# Patient Record
Sex: Female | Born: 1945 | Race: White | Hispanic: No | Marital: Married | Smoking: Never smoker
Health system: Southern US, Community
[De-identification: ages and names within clinical notes are randomized; demographics above are authoritative.]

## PROBLEM LIST (undated history)

## (undated) DIAGNOSIS — B019 Varicella without complication: Secondary | ICD-10-CM

## (undated) DIAGNOSIS — K635 Polyp of colon: Secondary | ICD-10-CM

## (undated) DIAGNOSIS — D473 Essential (hemorrhagic) thrombocythemia: Secondary | ICD-10-CM

## (undated) DIAGNOSIS — J45909 Unspecified asthma, uncomplicated: Secondary | ICD-10-CM

## (undated) DIAGNOSIS — D649 Anemia, unspecified: Secondary | ICD-10-CM

## (undated) DIAGNOSIS — K5790 Diverticulosis of intestine, part unspecified, without perforation or abscess without bleeding: Secondary | ICD-10-CM

## (undated) DIAGNOSIS — R7612 Nonspecific reaction to cell mediated immunity measurement of gamma interferon antigen response without active tuberculosis: Secondary | ICD-10-CM

## (undated) DIAGNOSIS — T7840XA Allergy, unspecified, initial encounter: Secondary | ICD-10-CM

## (undated) DIAGNOSIS — H409 Unspecified glaucoma: Secondary | ICD-10-CM

## (undated) DIAGNOSIS — K5792 Diverticulitis of intestine, part unspecified, without perforation or abscess without bleeding: Secondary | ICD-10-CM

## (undated) DIAGNOSIS — E785 Hyperlipidemia, unspecified: Secondary | ICD-10-CM

## (undated) HISTORY — DX: Anemia, unspecified: D64.9

## (undated) HISTORY — DX: Nonspecific reaction to cell mediated immunity measurement of gamma interferon antigen response without active tuberculosis: R76.12

## (undated) HISTORY — DX: Essential (hemorrhagic) thrombocythemia: D47.3

## (undated) HISTORY — DX: Polyp of colon: K63.5

## (undated) HISTORY — DX: Allergy, unspecified, initial encounter: T78.40XA

## (undated) HISTORY — DX: Hyperlipidemia, unspecified: E78.5

## (undated) HISTORY — DX: Varicella without complication: B01.9

## (undated) HISTORY — DX: Diverticulitis of intestine, part unspecified, without perforation or abscess without bleeding: K57.92

## (undated) HISTORY — PX: TRIGGER FINGER RELEASE: SHX641

## (undated) HISTORY — DX: Unspecified asthma, uncomplicated: J45.909

## (undated) HISTORY — DX: Diverticulosis of intestine, part unspecified, without perforation or abscess without bleeding: K57.90

## (undated) HISTORY — DX: Unspecified glaucoma: H40.9

---

## 2003-12-18 ENCOUNTER — Encounter: Payer: Self-pay | Admitting: General Practice

## 2004-01-18 ENCOUNTER — Encounter: Payer: Self-pay | Admitting: General Practice

## 2004-03-09 ENCOUNTER — Ambulatory Visit: Payer: Self-pay | Admitting: General Practice

## 2004-08-29 ENCOUNTER — Ambulatory Visit: Payer: Self-pay | Admitting: Unknown Physician Specialty

## 2005-02-06 ENCOUNTER — Ambulatory Visit: Payer: Self-pay | Admitting: Internal Medicine

## 2005-09-13 ENCOUNTER — Ambulatory Visit: Payer: Self-pay | Admitting: Unknown Physician Specialty

## 2006-10-01 ENCOUNTER — Ambulatory Visit: Payer: Self-pay | Admitting: Unknown Physician Specialty

## 2007-10-08 ENCOUNTER — Ambulatory Visit: Payer: Self-pay | Admitting: Unknown Physician Specialty

## 2008-01-26 ENCOUNTER — Ambulatory Visit: Payer: Self-pay | Admitting: Gastroenterology

## 2008-06-15 ENCOUNTER — Encounter: Payer: Self-pay | Admitting: Unknown Physician Specialty

## 2008-06-17 ENCOUNTER — Encounter: Payer: Self-pay | Admitting: Unknown Physician Specialty

## 2008-10-11 ENCOUNTER — Ambulatory Visit: Payer: Self-pay | Admitting: Unknown Physician Specialty

## 2009-07-12 ENCOUNTER — Other Ambulatory Visit: Payer: Self-pay | Admitting: Internal Medicine

## 2009-07-29 ENCOUNTER — Other Ambulatory Visit: Payer: Self-pay | Admitting: Internal Medicine

## 2009-10-12 ENCOUNTER — Ambulatory Visit: Payer: Self-pay | Admitting: Unknown Physician Specialty

## 2010-10-17 ENCOUNTER — Ambulatory Visit: Payer: Self-pay | Admitting: Unknown Physician Specialty

## 2010-10-24 ENCOUNTER — Other Ambulatory Visit: Payer: Self-pay | Admitting: Internal Medicine

## 2011-09-26 ENCOUNTER — Ambulatory Visit: Payer: Self-pay | Admitting: Family Medicine

## 2011-10-05 ENCOUNTER — Ambulatory Visit: Payer: Self-pay | Admitting: General Practice

## 2012-01-02 ENCOUNTER — Other Ambulatory Visit: Payer: Self-pay | Admitting: Internal Medicine

## 2012-01-02 LAB — COMPREHENSIVE METABOLIC PANEL
Albumin: 3.7 g/dL (ref 3.4–5.0)
Alkaline Phosphatase: 98 U/L (ref 50–136)
Anion Gap: 6 — ABNORMAL LOW (ref 7–16)
Bilirubin,Total: 0.4 mg/dL (ref 0.2–1.0)
Calcium, Total: 8.9 mg/dL (ref 8.5–10.1)
Chloride: 104 mmol/L (ref 98–107)
Co2: 30 mmol/L (ref 21–32)
EGFR (Non-African Amer.): >60
Glucose: 88 mg/dL (ref 65–99)
Osmolality: 280 (ref 275–301)
SGOT(AST): 19 U/L (ref 15–37)
SGPT (ALT): 20 U/L (ref 12–78)
Total Protein: 7.3 g/dL (ref 6.4–8.2)

## 2012-01-02 LAB — CBC WITH DIFFERENTIAL/PLATELET
Eosinophil #: 0.2 10*3/uL (ref 0.0–0.7)
HCT: 37.3 % (ref 35.0–47.0)
Lymphocyte #: 1.5 10*3/uL (ref 1.0–3.6)
MCH: 30.3 pg (ref 26.0–34.0)
MCHC: 34.1 g/dL (ref 32.0–36.0)
Monocyte #: 0.3 x10 3/mm (ref 0.2–0.9)
Neutrophil %: 58 %
Platelet: 399 10*3/uL (ref 150–440)
RBC: 4.2 10*6/uL (ref 3.80–5.20)

## 2012-10-30 ENCOUNTER — Ambulatory Visit: Payer: Self-pay | Admitting: Family Medicine

## 2013-05-25 LAB — HM PAP SMEAR: HM PAP: NORMAL

## 2013-05-25 LAB — HM MAMMOGRAPHY: HM MAMMO: NEGATIVE

## 2013-06-01 ENCOUNTER — Ambulatory Visit: Payer: Self-pay | Admitting: Gastroenterology

## 2013-06-02 ENCOUNTER — Ambulatory Visit: Payer: Self-pay | Admitting: Gastroenterology

## 2013-06-04 LAB — PATHOLOGY REPORT

## 2013-10-14 ENCOUNTER — Ambulatory Visit: Payer: Self-pay | Admitting: Internal Medicine

## 2013-10-14 LAB — IRON AND TIBC
IRON: 17 ug/dL — AB (ref 50–170)
Iron Bind.Cap.(Total): 212 ug/dL — ABNORMAL LOW (ref 250–450)
Iron Saturation: 8 %
UNBOUND IRON-BIND. CAP.: 195 ug/dL

## 2013-10-14 LAB — HEPATIC FUNCTION PANEL A (ARMC)
ALT: 23 U/L
Albumin: 2.4 g/dL — ABNORMAL LOW (ref 3.4–5.0)
Alkaline Phosphatase: 107 U/L
Bilirubin, Direct: <0.1 mg/dL (ref 0.00–0.20)
Bilirubin,Total: 0.2 mg/dL (ref 0.2–1.0)
SGOT(AST): 16 U/L (ref 15–37)
Total Protein: 7.1 g/dL (ref 6.4–8.2)

## 2013-10-14 LAB — CBC CANCER CENTER
BASOS ABS: 2 %
EOS PCT: 1 %
HCT: 31.2 % — ABNORMAL LOW (ref 35.0–47.0)
HGB: 10.1 g/dL — AB (ref 12.0–16.0)
LYMPHS PCT: 13 %
MCH: 27.9 pg (ref 26.0–34.0)
MCHC: 32.3 g/dL (ref 32.0–36.0)
MCV: 86 fL (ref 80–100)
MONOS PCT: 3 %
PLATELETS: 1464 x10 3/mm — AB (ref 150–440)
RBC: 3.61 10*6/uL — ABNORMAL LOW (ref 3.80–5.20)
RDW: 13.4 % (ref 11.5–14.5)
SEGMENTED NEUTROPHILS: 81 %
WBC: 12.7 x10 3/mm — ABNORMAL HIGH (ref 3.6–11.0)

## 2013-10-14 LAB — RETICULOCYTES
Absolute Retic Count: 0.0477 10*6/uL (ref 0.019–0.186)
Reticulocyte: 1.32 % (ref 0.4–3.1)

## 2013-10-14 LAB — APTT: Activated PTT: 43.5 secs — ABNORMAL HIGH (ref 23.6–35.9)

## 2013-10-14 LAB — PROTIME-INR
INR: 1.1
PROTHROMBIN TIME: 14.1 s (ref 11.5–14.7)

## 2013-10-14 LAB — CREATININE, SERUM
CREATININE: 0.82 mg/dL (ref 0.60–1.30)
EGFR (African American): >60
EGFR (Non-African Amer.): >60

## 2013-10-14 LAB — CALCIUM: CALCIUM: 9 mg/dL (ref 8.5–10.1)

## 2013-10-14 LAB — LACTATE DEHYDROGENASE: LDH: 177 U/L (ref 81–246)

## 2013-10-16 LAB — PROT IMMUNOELECTROPHORES(ARMC)

## 2013-10-16 LAB — URINE IEP, RANDOM

## 2013-10-17 ENCOUNTER — Ambulatory Visit: Payer: Self-pay | Admitting: Internal Medicine

## 2013-10-19 LAB — OCCULT BLOOD X 1 CARD TO LAB, STOOL
Occult Blood, Feces: NEGATIVE
Occult Blood, Feces: NEGATIVE

## 2013-10-20 LAB — CBC WITH DIFFERENTIAL/PLATELET
Basophil #: 0 10*3/uL (ref 0.0–0.1)
Basophil %: 0.2 %
Eosinophil #: 0.2 10*3/uL (ref 0.0–0.7)
Eosinophil %: 1.6 %
HCT: 29.9 % — ABNORMAL LOW (ref 35.0–47.0)
HGB: 9.7 g/dL — AB (ref 12.0–16.0)
LYMPHS PCT: 15 %
Lymphocyte #: 2 10*3/uL (ref 1.0–3.6)
MCH: 28.1 pg (ref 26.0–34.0)
MCHC: 32.5 g/dL (ref 32.0–36.0)
MCV: 87 fL (ref 80–100)
MONO ABS: 0.6 x10 3/mm (ref 0.2–0.9)
Monocyte %: 4.6 %
NEUTROS PCT: 78.6 %
Neutrophil #: 10.6 10*3/uL — ABNORMAL HIGH (ref 1.4–6.5)
PLATELETS: 1557 10*3/uL — AB (ref 150–440)
RBC: 3.45 10*6/uL — ABNORMAL LOW (ref 3.80–5.20)
RDW: 13.4 % (ref 11.5–14.5)
WBC: 13.5 10*3/uL — AB (ref 3.6–11.0)

## 2013-11-03 LAB — CBC CANCER CENTER
BASOS ABS: 0.1 x10 3/mm (ref 0.0–0.1)
BASOS PCT: 1.3 %
EOS PCT: 1.8 %
Eosinophil #: 0.1 x10 3/mm (ref 0.0–0.7)
HCT: 29.2 % — ABNORMAL LOW (ref 35.0–47.0)
HGB: 9.5 g/dL — ABNORMAL LOW (ref 12.0–16.0)
Lymphocyte #: 1.2 x10 3/mm (ref 1.0–3.6)
Lymphocyte %: 28.3 %
MCH: 28.4 pg (ref 26.0–34.0)
MCHC: 32.5 g/dL (ref 32.0–36.0)
MCV: 87 fL (ref 80–100)
MONO ABS: 0.3 x10 3/mm (ref 0.2–0.9)
MONOS PCT: 6.2 %
Neutrophil #: 2.7 x10 3/mm (ref 1.4–6.5)
Neutrophil %: 62.4 %
PLATELETS: 878 x10 3/mm — AB (ref 150–440)
RBC: 3.34 10*6/uL — AB (ref 3.80–5.20)
RDW: 13.8 % (ref 11.5–14.5)
WBC: 4.3 x10 3/mm (ref 3.6–11.0)

## 2013-11-10 LAB — CBC CANCER CENTER
Basophil #: 0 x10 3/mm (ref 0.0–0.1)
Basophil %: 1.3 %
EOS PCT: 1.9 %
Eosinophil #: 0.1 x10 3/mm (ref 0.0–0.7)
HCT: 28.6 % — ABNORMAL LOW (ref 35.0–47.0)
HGB: 9.3 g/dL — AB (ref 12.0–16.0)
LYMPHS ABS: 1.3 x10 3/mm (ref 1.0–3.6)
Lymphocyte %: 37.2 %
MCH: 29.1 pg (ref 26.0–34.0)
MCHC: 32.5 g/dL (ref 32.0–36.0)
MCV: 90 fL (ref 80–100)
Monocyte #: 0.2 x10 3/mm (ref 0.2–0.9)
Monocyte %: 5.7 %
Neutrophil #: 1.9 x10 3/mm (ref 1.4–6.5)
Neutrophil %: 53.9 %
Platelet: 624 x10 3/mm — ABNORMAL HIGH (ref 150–440)
RBC: 3.19 10*6/uL — ABNORMAL LOW (ref 3.80–5.20)
RDW: 14 % (ref 11.5–14.5)
WBC: 3.6 x10 3/mm (ref 3.6–11.0)

## 2013-11-17 ENCOUNTER — Ambulatory Visit: Payer: Self-pay | Admitting: Internal Medicine

## 2013-11-17 LAB — CBC CANCER CENTER
Basophil #: 0 x10 3/mm (ref 0.0–0.1)
Basophil %: 1.2 %
Eosinophil #: 0 x10 3/mm (ref 0.0–0.7)
Eosinophil %: 1.2 %
HCT: 29.1 % — AB (ref 35.0–47.0)
HGB: 9.5 g/dL — ABNORMAL LOW (ref 12.0–16.0)
LYMPHS PCT: 38.7 %
Lymphocyte #: 1.4 x10 3/mm (ref 1.0–3.6)
MCH: 30.2 pg (ref 26.0–34.0)
MCHC: 32.7 g/dL (ref 32.0–36.0)
MCV: 93 fL (ref 80–100)
MONOS PCT: 6.2 %
Monocyte #: 0.2 x10 3/mm (ref 0.2–0.9)
Neutrophil #: 1.8 x10 3/mm (ref 1.4–6.5)
Neutrophil %: 52.7 %
PLATELETS: 386 x10 3/mm (ref 150–440)
RBC: 3.15 10*6/uL — AB (ref 3.80–5.20)
RDW: 14.1 % (ref 11.5–14.5)
WBC: 3.5 x10 3/mm — AB (ref 3.6–11.0)

## 2013-11-20 LAB — CBC CANCER CENTER
Basophil #: 0 x10 3/mm (ref 0.0–0.1)
Basophil %: 1.5 %
EOS PCT: 1.5 %
Eosinophil #: 0 x10 3/mm (ref 0.0–0.7)
HCT: 28 % — AB (ref 35.0–47.0)
HGB: 9.1 g/dL — AB (ref 12.0–16.0)
LYMPHS ABS: 1.4 x10 3/mm (ref 1.0–3.6)
LYMPHS PCT: 44 %
MCH: 30.2 pg (ref 26.0–34.0)
MCHC: 32.6 g/dL (ref 32.0–36.0)
MCV: 93 fL (ref 80–100)
Monocyte #: 0.2 x10 3/mm (ref 0.2–0.9)
Monocyte %: 5.9 %
NEUTROS ABS: 1.5 x10 3/mm (ref 1.4–6.5)
NEUTROS PCT: 47.1 %
PLATELETS: 288 x10 3/mm (ref 150–440)
RBC: 3.02 10*6/uL — ABNORMAL LOW (ref 3.80–5.20)
RDW: 25.6 % — ABNORMAL HIGH (ref 11.5–14.5)
WBC: 3.2 x10 3/mm — ABNORMAL LOW (ref 3.6–11.0)

## 2013-11-30 ENCOUNTER — Ambulatory Visit: Payer: Self-pay | Admitting: Internal Medicine

## 2013-11-30 LAB — BASIC METABOLIC PANEL
Anion Gap: 3 — ABNORMAL LOW (ref 7–16)
BUN: 14 mg/dL (ref 7–18)
CREATININE: 0.77 mg/dL (ref 0.60–1.30)
Calcium, Total: 8.7 mg/dL (ref 8.5–10.1)
Chloride: 105 mmol/L (ref 98–107)
Co2: 32 mmol/L (ref 21–32)
EGFR (African American): >60
EGFR (Non-African Amer.): >60
Glucose: 86 mg/dL (ref 65–99)
OSMOLALITY: 279 (ref 275–301)
Potassium: 3.8 mmol/L (ref 3.5–5.1)
Sodium: 140 mmol/L (ref 136–145)

## 2013-11-30 LAB — CBC CANCER CENTER
BASOS PCT: 1.4 %
Basophil #: 0.1 x10 3/mm (ref 0.0–0.1)
EOS PCT: 3 %
Eosinophil #: 0.1 x10 3/mm (ref 0.0–0.7)
HCT: 30.5 % — AB (ref 35.0–47.0)
HGB: 9.9 g/dL — ABNORMAL LOW (ref 12.0–16.0)
LYMPHS ABS: 1.3 x10 3/mm (ref 1.0–3.6)
LYMPHS PCT: 33.3 %
MCH: 31.6 pg (ref 26.0–34.0)
MCHC: 32.5 g/dL (ref 32.0–36.0)
MCV: 97 fL (ref 80–100)
Monocyte #: 0.2 x10 3/mm (ref 0.2–0.9)
Monocyte %: 5.7 %
NEUTROS ABS: 2.2 x10 3/mm (ref 1.4–6.5)
Neutrophil %: 56.6 %
PLATELETS: 213 x10 3/mm (ref 150–440)
RBC: 3.14 10*6/uL — ABNORMAL LOW (ref 3.80–5.20)
RDW: 32.3 % — ABNORMAL HIGH (ref 11.5–14.5)
WBC: 3.8 x10 3/mm (ref 3.6–11.0)

## 2013-11-30 LAB — HEPATIC FUNCTION PANEL A (ARMC)
ALT: 16 U/L
Albumin: 3.1 g/dL — ABNORMAL LOW (ref 3.4–5.0)
Alkaline Phosphatase: 97 U/L
BILIRUBIN TOTAL: 0.3 mg/dL (ref 0.2–1.0)
Bilirubin, Direct: <0.05 mg/dL (ref 0.00–0.20)
SGOT(AST): 15 U/L (ref 15–37)
TOTAL PROTEIN: 6.8 g/dL (ref 6.4–8.2)

## 2013-12-08 LAB — CBC CANCER CENTER
Basophil #: 0 x10 3/mm (ref 0.0–0.1)
Basophil %: 1 %
Eosinophil #: 0.1 x10 3/mm (ref 0.0–0.7)
Eosinophil %: 2.3 %
HCT: 30.9 % — AB (ref 35.0–47.0)
HGB: 10.1 g/dL — AB (ref 12.0–16.0)
Lymphocyte #: 1.4 x10 3/mm (ref 1.0–3.6)
Lymphocyte %: 37.6 %
MCH: 32.4 pg (ref 26.0–34.0)
MCHC: 32.6 g/dL (ref 32.0–36.0)
MCV: 99 fL (ref 80–100)
Monocyte #: 0.2 x10 3/mm (ref 0.2–0.9)
Monocyte %: 6.8 %
NEUTROS ABS: 1.9 x10 3/mm (ref 1.4–6.5)
Neutrophil %: 52.3 %
PLATELETS: 288 x10 3/mm (ref 150–440)
RBC: 3.11 10*6/uL — AB (ref 3.80–5.20)
RDW: 31.3 % — ABNORMAL HIGH (ref 11.5–14.5)
WBC: 3.6 x10 3/mm (ref 3.6–11.0)

## 2013-12-15 LAB — CBC CANCER CENTER
Basophil #: 0 x10 3/mm (ref 0.0–0.1)
Basophil %: 0.8 %
Eosinophil #: 0.1 x10 3/mm (ref 0.0–0.7)
Eosinophil %: 2 %
HCT: 31.7 % — ABNORMAL LOW (ref 35.0–47.0)
HGB: 10.2 g/dL — ABNORMAL LOW (ref 12.0–16.0)
LYMPHS PCT: 31.7 %
Lymphocyte #: 1.2 x10 3/mm (ref 1.0–3.6)
MCH: 32.6 pg (ref 26.0–34.0)
MCHC: 32.3 g/dL (ref 32.0–36.0)
MCV: 101 fL — AB (ref 80–100)
Monocyte #: 0.2 x10 3/mm (ref 0.2–0.9)
Monocyte %: 5.5 %
NEUTROS ABS: 2.4 x10 3/mm (ref 1.4–6.5)
Neutrophil %: 60 %
Platelet: 359 x10 3/mm (ref 150–440)
RBC: 3.14 10*6/uL — AB (ref 3.80–5.20)
RDW: 30.8 % — ABNORMAL HIGH (ref 11.5–14.5)
WBC: 3.9 x10 3/mm (ref 3.6–11.0)

## 2013-12-17 ENCOUNTER — Ambulatory Visit: Payer: Self-pay | Admitting: Internal Medicine

## 2013-12-22 LAB — CBC CANCER CENTER
BASOS ABS: 0 x10 3/mm (ref 0.0–0.1)
Basophil %: 1.1 %
EOS ABS: 0.1 x10 3/mm (ref 0.0–0.7)
Eosinophil %: 1.3 %
HCT: 32.5 % — ABNORMAL LOW (ref 35.0–47.0)
HGB: 10.6 g/dL — ABNORMAL LOW (ref 12.0–16.0)
Lymphocyte #: 1.4 x10 3/mm (ref 1.0–3.6)
Lymphocyte %: 34.1 %
MCH: 33.2 pg (ref 26.0–34.0)
MCHC: 32.6 g/dL (ref 32.0–36.0)
MCV: 102 fL — AB (ref 80–100)
MONOS PCT: 8.4 %
Monocyte #: 0.3 x10 3/mm (ref 0.2–0.9)
NEUTROS ABS: 2.3 x10 3/mm (ref 1.4–6.5)
NEUTROS PCT: 55.1 %
PLATELETS: 328 x10 3/mm (ref 150–440)
RBC: 3.19 10*6/uL — AB (ref 3.80–5.20)
RDW: 30.1 % — ABNORMAL HIGH (ref 11.5–14.5)
WBC: 4.1 x10 3/mm (ref 3.6–11.0)

## 2013-12-29 LAB — CBC CANCER CENTER
BASOS ABS: 0.1 x10 3/mm (ref 0.0–0.1)
Basophil %: 1.2 %
Eosinophil #: 0.1 x10 3/mm (ref 0.0–0.7)
Eosinophil %: 1.2 %
HCT: 33.2 % — AB (ref 35.0–47.0)
HGB: 10.7 g/dL — ABNORMAL LOW (ref 12.0–16.0)
LYMPHS PCT: 32.5 %
Lymphocyte #: 1.5 x10 3/mm (ref 1.0–3.6)
MCH: 33.5 pg (ref 26.0–34.0)
MCHC: 32.4 g/dL (ref 32.0–36.0)
MCV: 103 fL — AB (ref 80–100)
MONO ABS: 0.3 x10 3/mm (ref 0.2–0.9)
Monocyte %: 7.3 %
NEUTROS PCT: 57.8 %
Neutrophil #: 2.7 x10 3/mm (ref 1.4–6.5)
PLATELETS: 291 x10 3/mm (ref 150–440)
RBC: 3.21 10*6/uL — AB (ref 3.80–5.20)
RDW: 28.5 % — ABNORMAL HIGH (ref 11.5–14.5)
WBC: 4.7 x10 3/mm (ref 3.6–11.0)

## 2014-01-12 LAB — BASIC METABOLIC PANEL
Anion Gap: 4 — ABNORMAL LOW (ref 7–16)
BUN: 15 mg/dL (ref 7–18)
CALCIUM: 8.2 mg/dL — AB (ref 8.5–10.1)
Chloride: 110 mmol/L — ABNORMAL HIGH (ref 98–107)
Co2: 31 mmol/L (ref 21–32)
Creatinine: 0.82 mg/dL (ref 0.60–1.30)
EGFR (African American): >60
EGFR (Non-African Amer.): >60
GLUCOSE: 75 mg/dL (ref 65–99)
Osmolality: 288 (ref 275–301)
Potassium: 3.7 mmol/L (ref 3.5–5.1)
Sodium: 145 mmol/L (ref 136–145)

## 2014-01-12 LAB — FERRITIN: FERRITIN (ARMC): 19 ng/mL (ref 8–388)

## 2014-01-12 LAB — CBC CANCER CENTER
Basophil #: 0.1 x10 3/mm (ref 0.0–0.1)
Basophil %: 1.2 %
Eosinophil #: 0.2 x10 3/mm (ref 0.0–0.7)
Eosinophil %: 3.8 %
HCT: 35.6 % (ref 35.0–47.0)
HGB: 11.7 g/dL — AB (ref 12.0–16.0)
LYMPHS ABS: 1.5 x10 3/mm (ref 1.0–3.6)
LYMPHS PCT: 33.3 %
MCH: 35.1 pg — ABNORMAL HIGH (ref 26.0–34.0)
MCHC: 32.9 g/dL (ref 32.0–36.0)
MCV: 107 fL — AB (ref 80–100)
Monocyte #: 0.3 x10 3/mm (ref 0.2–0.9)
Monocyte %: 6 %
NEUTROS PCT: 55.7 %
Neutrophil #: 2.4 x10 3/mm (ref 1.4–6.5)
PLATELETS: 295 x10 3/mm (ref 150–440)
RBC: 3.34 10*6/uL — AB (ref 3.80–5.20)
RDW: 25.3 % — AB (ref 11.5–14.5)
WBC: 4.4 x10 3/mm (ref 3.6–11.0)

## 2014-01-12 LAB — HEPATIC FUNCTION PANEL A (ARMC)
ALT: 18 U/L
AST: 25 U/L (ref 15–37)
Albumin: 3.5 g/dL (ref 3.4–5.0)
Alkaline Phosphatase: 91 U/L
BILIRUBIN TOTAL: 0.2 mg/dL (ref 0.2–1.0)
Total Protein: 6.7 g/dL (ref 6.4–8.2)

## 2014-01-12 LAB — IRON AND TIBC
IRON BIND. CAP.(TOTAL): 327 ug/dL (ref 250–450)
IRON: 59 ug/dL (ref 50–170)
Iron Saturation: 18 %
Unbound Iron-Bind.Cap.: 268 ug/dL

## 2014-01-17 ENCOUNTER — Ambulatory Visit: Payer: Self-pay | Admitting: Internal Medicine

## 2014-01-26 LAB — CBC CANCER CENTER
Basophil #: 0.1 x10 3/mm (ref 0.0–0.1)
Basophil %: 1.2 %
EOS PCT: 3.4 %
Eosinophil #: 0.2 x10 3/mm (ref 0.0–0.7)
HCT: 35.8 % (ref 35.0–47.0)
HGB: 11.7 g/dL — ABNORMAL LOW (ref 12.0–16.0)
Lymphocyte #: 1.6 x10 3/mm (ref 1.0–3.6)
Lymphocyte %: 29.4 %
MCH: 35.3 pg — ABNORMAL HIGH (ref 26.0–34.0)
MCHC: 32.6 g/dL (ref 32.0–36.0)
MCV: 109 fL — AB (ref 80–100)
Monocyte #: 0.3 x10 3/mm (ref 0.2–0.9)
Monocyte %: 5.6 %
Neutrophil #: 3.2 x10 3/mm (ref 1.4–6.5)
Neutrophil %: 60.4 %
Platelet: 252 x10 3/mm (ref 150–440)
RBC: 3.3 10*6/uL — ABNORMAL LOW (ref 3.80–5.20)
RDW: 17 % — ABNORMAL HIGH (ref 11.5–14.5)
WBC: 5.3 x10 3/mm (ref 3.6–11.0)

## 2014-02-09 LAB — CBC CANCER CENTER
Basophil #: 0 x10 3/mm (ref 0.0–0.1)
Basophil %: 0.7 %
EOS PCT: 1.6 %
Eosinophil #: 0 x10 3/mm (ref 0.0–0.7)
HCT: 37 % (ref 35.0–47.0)
HGB: 12.1 g/dL (ref 12.0–16.0)
LYMPHS ABS: 0.9 x10 3/mm — AB (ref 1.0–3.6)
Lymphocyte %: 29.4 %
MCH: 35.5 pg — AB (ref 26.0–34.0)
MCHC: 32.8 g/dL (ref 32.0–36.0)
MCV: 108 fL — AB (ref 80–100)
Monocyte #: 0.2 x10 3/mm (ref 0.2–0.9)
Monocyte %: 8.2 %
Neutrophil #: 1.8 x10 3/mm (ref 1.4–6.5)
Neutrophil %: 60.1 %
Platelet: 242 x10 3/mm (ref 150–440)
RBC: 3.42 10*6/uL — ABNORMAL LOW (ref 3.80–5.20)
RDW: 11.6 % (ref 11.5–14.5)
WBC: 3 x10 3/mm — AB (ref 3.6–11.0)

## 2014-02-16 ENCOUNTER — Ambulatory Visit: Payer: Self-pay | Admitting: Internal Medicine

## 2014-02-23 LAB — CBC CANCER CENTER
BASOS PCT: 1.4 %
Basophil #: 0.1 x10 3/mm (ref 0.0–0.1)
Eosinophil #: 0.1 x10 3/mm (ref 0.0–0.7)
Eosinophil %: 1.9 %
HCT: 35.6 % (ref 35.0–47.0)
HGB: 11.8 g/dL — AB (ref 12.0–16.0)
LYMPHS ABS: 1.6 x10 3/mm (ref 1.0–3.6)
Lymphocyte %: 31.1 %
MCH: 35.6 pg — AB (ref 26.0–34.0)
MCHC: 33.2 g/dL (ref 32.0–36.0)
MCV: 107 fL — ABNORMAL HIGH (ref 80–100)
MONOS PCT: 5.7 %
Monocyte #: 0.3 x10 3/mm (ref 0.2–0.9)
NEUTROS ABS: 3.1 x10 3/mm (ref 1.4–6.5)
Neutrophil %: 59.9 %
Platelet: 305 x10 3/mm (ref 150–440)
RBC: 3.33 10*6/uL — AB (ref 3.80–5.20)
RDW: 11.5 % (ref 11.5–14.5)
WBC: 5.2 x10 3/mm (ref 3.6–11.0)

## 2014-03-09 LAB — HEPATIC FUNCTION PANEL A (ARMC)
ALBUMIN: 3.3 g/dL — AB (ref 3.4–5.0)
AST: 16 U/L (ref 15–37)
Alkaline Phosphatase: 101 U/L
Bilirubin,Total: 0.2 mg/dL (ref 0.2–1.0)
SGPT (ALT): 22 U/L
Total Protein: 6.6 g/dL (ref 6.4–8.2)

## 2014-03-09 LAB — BASIC METABOLIC PANEL
Anion Gap: 6 — ABNORMAL LOW (ref 7–16)
BUN: 19 mg/dL — ABNORMAL HIGH (ref 7–18)
Calcium, Total: 8.4 mg/dL — ABNORMAL LOW (ref 8.5–10.1)
Chloride: 104 mmol/L (ref 98–107)
Co2: 30 mmol/L (ref 21–32)
Creatinine: 0.76 mg/dL (ref 0.60–1.30)
Glucose: 85 mg/dL (ref 65–99)
Osmolality: 281 (ref 275–301)
POTASSIUM: 4.1 mmol/L (ref 3.5–5.1)
Sodium: 140 mmol/L (ref 136–145)

## 2014-03-09 LAB — CBC CANCER CENTER
BASOS ABS: 0.1 x10 3/mm (ref 0.0–0.1)
BASOS PCT: 0.9 %
EOS ABS: 0.1 x10 3/mm (ref 0.0–0.7)
Eosinophil %: 2.3 %
HCT: 34.9 % — ABNORMAL LOW (ref 35.0–47.0)
HGB: 11.4 g/dL — ABNORMAL LOW (ref 12.0–16.0)
LYMPHS ABS: 1.9 x10 3/mm (ref 1.0–3.6)
Lymphocyte %: 29.5 %
MCH: 34.8 pg — AB (ref 26.0–34.0)
MCHC: 32.6 g/dL (ref 32.0–36.0)
MCV: 107 fL — ABNORMAL HIGH (ref 80–100)
MONO ABS: 0.4 x10 3/mm (ref 0.2–0.9)
Monocyte %: 6.2 %
NEUTROS ABS: 4 x10 3/mm (ref 1.4–6.5)
NEUTROS PCT: 61.1 %
PLATELETS: 290 x10 3/mm (ref 150–440)
RBC: 3.27 10*6/uL — ABNORMAL LOW (ref 3.80–5.20)
RDW: 11.6 % (ref 11.5–14.5)
WBC: 6.5 x10 3/mm (ref 3.6–11.0)

## 2014-03-19 ENCOUNTER — Ambulatory Visit: Payer: Self-pay | Admitting: Internal Medicine

## 2014-03-23 LAB — CBC CANCER CENTER
BASOS ABS: 0.1 x10 3/mm (ref 0.0–0.1)
Basophil %: 1.2 %
EOS ABS: 0.1 x10 3/mm (ref 0.0–0.7)
Eosinophil %: 1.8 %
HCT: 35.5 % (ref 35.0–47.0)
HGB: 11.8 g/dL — AB (ref 12.0–16.0)
LYMPHS PCT: 26 %
Lymphocyte #: 1.7 x10 3/mm (ref 1.0–3.6)
MCH: 34.8 pg — ABNORMAL HIGH (ref 26.0–34.0)
MCHC: 33.2 g/dL (ref 32.0–36.0)
MCV: 105 fL — ABNORMAL HIGH (ref 80–100)
Monocyte #: 0.3 x10 3/mm (ref 0.2–0.9)
Monocyte %: 5.3 %
Neutrophil #: 4.2 x10 3/mm (ref 1.4–6.5)
Neutrophil %: 65.7 %
PLATELETS: 303 x10 3/mm (ref 150–440)
RBC: 3.38 10*6/uL — ABNORMAL LOW (ref 3.80–5.20)
RDW: 11.2 % — AB (ref 11.5–14.5)
WBC: 6.4 x10 3/mm (ref 3.6–11.0)

## 2014-04-06 LAB — CBC CANCER CENTER
BASOS ABS: 0 x10 3/mm (ref 0.0–0.1)
BASOS PCT: 1 %
Eosinophil #: 0.1 x10 3/mm (ref 0.0–0.7)
Eosinophil %: 2.8 %
HCT: 36.4 % (ref 35.0–47.0)
HGB: 12.1 g/dL (ref 12.0–16.0)
LYMPHS ABS: 1.5 x10 3/mm (ref 1.0–3.6)
LYMPHS PCT: 29.8 %
MCH: 34.3 pg — ABNORMAL HIGH (ref 26.0–34.0)
MCHC: 33.3 g/dL (ref 32.0–36.0)
MCV: 103 fL — AB (ref 80–100)
Monocyte #: 0.4 x10 3/mm (ref 0.2–0.9)
Monocyte %: 7.3 %
NEUTROS ABS: 2.9 x10 3/mm (ref 1.4–6.5)
Neutrophil %: 59.1 %
Platelet: 312 x10 3/mm (ref 150–440)
RBC: 3.54 10*6/uL — AB (ref 3.80–5.20)
RDW: 11.4 % — ABNORMAL LOW (ref 11.5–14.5)
WBC: 5 x10 3/mm (ref 3.6–11.0)

## 2014-04-19 ENCOUNTER — Ambulatory Visit: Payer: Self-pay | Admitting: Internal Medicine

## 2014-04-20 LAB — CBC CANCER CENTER
BASOS ABS: 0.1 x10 3/mm (ref 0.0–0.1)
Basophil %: 1.3 %
EOS PCT: 2.8 %
Eosinophil #: 0.1 x10 3/mm (ref 0.0–0.7)
HCT: 35.7 % (ref 35.0–47.0)
HGB: 11.8 g/dL — ABNORMAL LOW (ref 12.0–16.0)
LYMPHS ABS: 1.6 x10 3/mm (ref 1.0–3.6)
Lymphocyte %: 31 %
MCH: 34 pg (ref 26.0–34.0)
MCHC: 33.2 g/dL (ref 32.0–36.0)
MCV: 102 fL — AB (ref 80–100)
MONOS PCT: 5.6 %
Monocyte #: 0.3 x10 3/mm (ref 0.2–0.9)
Neutrophil #: 3 x10 3/mm (ref 1.4–6.5)
Neutrophil %: 59.3 %
Platelet: 334 x10 3/mm (ref 150–440)
RBC: 3.48 10*6/uL — AB (ref 3.80–5.20)
RDW: 11.5 % (ref 11.5–14.5)
WBC: 5.1 x10 3/mm (ref 3.6–11.0)

## 2014-05-18 ENCOUNTER — Ambulatory Visit
Admit: 2014-05-18 | Disposition: A | Discharge status: Home / Self Care | Payer: Self-pay | Attending: Internal Medicine | Admitting: Internal Medicine

## 2014-06-14 ENCOUNTER — Ambulatory Visit: Payer: Self-pay | Admitting: General Practice

## 2014-06-18 ENCOUNTER — Ambulatory Visit
Admit: 2014-06-18 | Disposition: A | Discharge status: Home / Self Care | Payer: Self-pay | Attending: Internal Medicine | Admitting: Internal Medicine

## 2014-06-29 LAB — CBC CANCER CENTER
BASOS PCT: 1.3 %
Basophil #: 0.1 x10 3/mm (ref 0.0–0.1)
EOS PCT: 3 %
Eosinophil #: 0.2 x10 3/mm (ref 0.0–0.7)
HCT: 35.9 % (ref 35.0–47.0)
HGB: 12.1 g/dL (ref 12.0–16.0)
Lymphocyte #: 1.5 x10 3/mm (ref 1.0–3.6)
Lymphocyte %: 25 %
MCH: 33.7 pg (ref 26.0–34.0)
MCHC: 33.6 g/dL (ref 32.0–36.0)
MCV: 100 fL (ref 80–100)
Monocyte #: 0.3 x10 3/mm (ref 0.2–0.9)
Monocyte %: 4.7 %
NEUTROS PCT: 66 %
Neutrophil #: 3.8 x10 3/mm (ref 1.4–6.5)
Platelet: 326 x10 3/mm (ref 150–440)
RBC: 3.58 10*6/uL — AB (ref 3.80–5.20)
RDW: 12.5 % (ref 11.5–14.5)
WBC: 5.8 x10 3/mm (ref 3.6–11.0)

## 2014-07-26 ENCOUNTER — Other Ambulatory Visit: Payer: Self-pay | Admitting: *Deleted

## 2014-07-26 DIAGNOSIS — D473 Essential (hemorrhagic) thrombocythemia: Secondary | ICD-10-CM

## 2014-07-27 ENCOUNTER — Other Ambulatory Visit: Payer: Self-pay

## 2014-07-29 ENCOUNTER — Inpatient Hospital Stay: Payer: Medicare Other | Attending: Internal Medicine

## 2014-07-29 ENCOUNTER — Encounter (INDEPENDENT_AMBULATORY_CARE_PROVIDER_SITE_OTHER): Payer: Self-pay

## 2014-07-29 DIAGNOSIS — D473 Essential (hemorrhagic) thrombocythemia: Secondary | ICD-10-CM | POA: Insufficient documentation

## 2014-07-29 LAB — HEPATIC FUNCTION PANEL
ALT: 12 U/L — AB (ref 14–54)
AST: 20 U/L (ref 15–41)
Albumin: 3.9 g/dL (ref 3.5–5.0)
Alkaline Phosphatase: 76 U/L (ref 38–126)
Bilirubin, Direct: <0.1 mg/dL — ABNORMAL LOW (ref 0.1–0.5)
TOTAL PROTEIN: 6.7 g/dL (ref 6.5–8.1)
Total Bilirubin: 0.4 mg/dL (ref 0.3–1.2)

## 2014-07-29 LAB — CBC WITH DIFFERENTIAL/PLATELET
BASOS ABS: 0 10*3/uL (ref 0–0.1)
Basophils Relative: 1 %
EOS ABS: 0.2 10*3/uL (ref 0–0.7)
Eosinophils Relative: 3 %
HCT: 35.4 % (ref 35.0–47.0)
HEMOGLOBIN: 11.7 g/dL — AB (ref 12.0–16.0)
Lymphocytes Relative: 33 %
Lymphs Abs: 1.8 10*3/uL (ref 1.0–3.6)
MCH: 32.7 pg (ref 26.0–34.0)
MCHC: 33 g/dL (ref 32.0–36.0)
MCV: 99.1 fL (ref 80.0–100.0)
MONOS PCT: 5 %
Monocytes Absolute: 0.3 10*3/uL (ref 0.2–0.9)
NEUTROS ABS: 3.2 10*3/uL (ref 1.4–6.5)
NEUTROS PCT: 58 %
PLATELETS: 321 10*3/uL (ref 150–440)
RBC: 3.58 MIL/uL — ABNORMAL LOW (ref 3.80–5.20)
RDW: 12.7 % (ref 11.5–14.5)
WBC: 5.6 10*3/uL (ref 3.6–11.0)

## 2014-07-29 LAB — BASIC METABOLIC PANEL
Anion gap: 2 — ABNORMAL LOW (ref 5–15)
BUN: 15 mg/dL (ref 6–20)
CALCIUM: 8.3 mg/dL — AB (ref 8.9–10.3)
CO2: 28 mmol/L (ref 22–32)
Chloride: 103 mmol/L (ref 101–111)
Creatinine, Ser: 0.81 mg/dL (ref 0.44–1.00)
GLUCOSE: 103 mg/dL — AB (ref 65–99)
Potassium: 3.8 mmol/L (ref 3.5–5.1)
Sodium: 133 mmol/L — ABNORMAL LOW (ref 135–145)

## 2014-08-24 ENCOUNTER — Inpatient Hospital Stay: Payer: Medicare Other | Attending: Internal Medicine

## 2014-08-24 DIAGNOSIS — D473 Essential (hemorrhagic) thrombocythemia: Secondary | ICD-10-CM | POA: Insufficient documentation

## 2014-08-24 LAB — CBC WITH DIFFERENTIAL/PLATELET
BASOS PCT: 1 %
Basophils Absolute: 0.1 10*3/uL (ref 0–0.1)
EOS ABS: 0.1 10*3/uL (ref 0–0.7)
EOS PCT: 3 %
HEMATOCRIT: 37.1 % (ref 35.0–47.0)
Hemoglobin: 12 g/dL (ref 12.0–16.0)
Lymphocytes Relative: 29 %
Lymphs Abs: 1.6 10*3/uL (ref 1.0–3.6)
MCH: 32.4 pg (ref 26.0–34.0)
MCHC: 32.4 g/dL (ref 32.0–36.0)
MCV: 100.1 fL — AB (ref 80.0–100.0)
MONOS PCT: 3 %
Monocytes Absolute: 0.2 10*3/uL (ref 0.2–0.9)
Neutro Abs: 3.7 10*3/uL (ref 1.4–6.5)
Neutrophils Relative %: 64 %
Platelets: 339 10*3/uL (ref 150–440)
RBC: 3.71 MIL/uL — AB (ref 3.80–5.20)
RDW: 12.8 % (ref 11.5–14.5)
WBC: 5.7 10*3/uL (ref 3.6–11.0)

## 2014-09-21 ENCOUNTER — Inpatient Hospital Stay: Payer: Medicare Other | Attending: Internal Medicine

## 2014-09-21 DIAGNOSIS — D473 Essential (hemorrhagic) thrombocythemia: Secondary | ICD-10-CM | POA: Diagnosis not present

## 2014-09-21 LAB — CBC WITH DIFFERENTIAL/PLATELET
BASOS ABS: 0.1 10*3/uL (ref 0–0.1)
BASOS PCT: 1 %
Eosinophils Absolute: 0.1 10*3/uL (ref 0–0.7)
Eosinophils Relative: 2 %
HEMATOCRIT: 36.2 % (ref 35.0–47.0)
Hemoglobin: 11.8 g/dL — ABNORMAL LOW (ref 12.0–16.0)
LYMPHS PCT: 31 %
Lymphs Abs: 1.8 10*3/uL (ref 1.0–3.6)
MCH: 32.7 pg (ref 26.0–34.0)
MCHC: 32.6 g/dL (ref 32.0–36.0)
MCV: 100.2 fL — ABNORMAL HIGH (ref 80.0–100.0)
MONO ABS: 0.3 10*3/uL (ref 0.2–0.9)
Monocytes Relative: 5 %
NEUTROS ABS: 3.5 10*3/uL (ref 1.4–6.5)
NEUTROS PCT: 61 %
PLATELETS: 316 10*3/uL (ref 150–440)
RBC: 3.61 MIL/uL — ABNORMAL LOW (ref 3.80–5.20)
RDW: 12.8 % (ref 11.5–14.5)
WBC: 5.8 10*3/uL (ref 3.6–11.0)

## 2014-10-19 ENCOUNTER — Inpatient Hospital Stay: Payer: Medicare Other | Attending: Internal Medicine

## 2014-10-19 DIAGNOSIS — K5790 Diverticulosis of intestine, part unspecified, without perforation or abscess without bleeding: Secondary | ICD-10-CM | POA: Diagnosis not present

## 2014-10-19 DIAGNOSIS — Z7982 Long term (current) use of aspirin: Secondary | ICD-10-CM | POA: Insufficient documentation

## 2014-10-19 DIAGNOSIS — E785 Hyperlipidemia, unspecified: Secondary | ICD-10-CM | POA: Insufficient documentation

## 2014-10-19 DIAGNOSIS — D72829 Elevated white blood cell count, unspecified: Secondary | ICD-10-CM | POA: Diagnosis not present

## 2014-10-19 DIAGNOSIS — J45909 Unspecified asthma, uncomplicated: Secondary | ICD-10-CM | POA: Insufficient documentation

## 2014-10-19 DIAGNOSIS — Z79899 Other long term (current) drug therapy: Secondary | ICD-10-CM | POA: Diagnosis not present

## 2014-10-19 DIAGNOSIS — D649 Anemia, unspecified: Secondary | ICD-10-CM | POA: Diagnosis not present

## 2014-10-19 DIAGNOSIS — D473 Essential (hemorrhagic) thrombocythemia: Secondary | ICD-10-CM | POA: Diagnosis not present

## 2014-10-19 DIAGNOSIS — D7589 Other specified diseases of blood and blood-forming organs: Secondary | ICD-10-CM | POA: Diagnosis not present

## 2014-10-19 LAB — CBC WITH DIFFERENTIAL/PLATELET
BASOS PCT: 1 %
Basophils Absolute: 0.1 10*3/uL (ref 0–0.1)
EOS PCT: 2 %
Eosinophils Absolute: 0.1 10*3/uL (ref 0–0.7)
HCT: 35.2 % (ref 35.0–47.0)
Hemoglobin: 12 g/dL (ref 12.0–16.0)
Lymphocytes Relative: 24 %
Lymphs Abs: 1.4 10*3/uL (ref 1.0–3.6)
MCH: 33.7 pg (ref 26.0–34.0)
MCHC: 34 g/dL (ref 32.0–36.0)
MCV: 99.1 fL (ref 80.0–100.0)
Monocytes Absolute: 0.3 10*3/uL (ref 0.2–0.9)
Monocytes Relative: 5 %
NEUTROS ABS: 3.8 10*3/uL (ref 1.4–6.5)
Neutrophils Relative %: 68 %
Platelets: 301 10*3/uL (ref 150–440)
RBC: 3.56 MIL/uL — ABNORMAL LOW (ref 3.80–5.20)
RDW: 12.8 % (ref 11.5–14.5)
WBC: 5.6 10*3/uL (ref 3.6–11.0)

## 2014-11-16 ENCOUNTER — Inpatient Hospital Stay (HOSPITAL_BASED_OUTPATIENT_CLINIC_OR_DEPARTMENT_OTHER): Payer: Medicare Other | Admitting: Internal Medicine

## 2014-11-16 ENCOUNTER — Encounter: Payer: Self-pay | Admitting: Internal Medicine

## 2014-11-16 ENCOUNTER — Inpatient Hospital Stay: Payer: Medicare Other

## 2014-11-16 VITALS — BP 108/71 | HR 69 | Temp 97.4°F | Resp 18 | Ht 65.51 in | Wt 142.4 lb

## 2014-11-16 DIAGNOSIS — K5790 Diverticulosis of intestine, part unspecified, without perforation or abscess without bleeding: Secondary | ICD-10-CM

## 2014-11-16 DIAGNOSIS — D473 Essential (hemorrhagic) thrombocythemia: Secondary | ICD-10-CM | POA: Diagnosis not present

## 2014-11-16 DIAGNOSIS — Z7982 Long term (current) use of aspirin: Secondary | ICD-10-CM

## 2014-11-16 DIAGNOSIS — D72829 Elevated white blood cell count, unspecified: Secondary | ICD-10-CM

## 2014-11-16 DIAGNOSIS — D7589 Other specified diseases of blood and blood-forming organs: Secondary | ICD-10-CM

## 2014-11-16 DIAGNOSIS — E785 Hyperlipidemia, unspecified: Secondary | ICD-10-CM

## 2014-11-16 DIAGNOSIS — Z79899 Other long term (current) drug therapy: Secondary | ICD-10-CM

## 2014-11-16 DIAGNOSIS — J45909 Unspecified asthma, uncomplicated: Secondary | ICD-10-CM

## 2014-11-16 DIAGNOSIS — D649 Anemia, unspecified: Secondary | ICD-10-CM | POA: Diagnosis not present

## 2014-11-16 LAB — BASIC METABOLIC PANEL
Anion gap: 2 — ABNORMAL LOW (ref 5–15)
BUN: 16 mg/dL (ref 6–20)
CO2: 30 mmol/L (ref 22–32)
Calcium: 8.4 mg/dL — ABNORMAL LOW (ref 8.9–10.3)
Chloride: 105 mmol/L (ref 101–111)
Creatinine, Ser: 0.7 mg/dL (ref 0.44–1.00)
GFR calc Af Amer: >60 mL/min (ref >60)
Glucose, Bld: 85 mg/dL (ref 65–99)
POTASSIUM: 4.1 mmol/L (ref 3.5–5.1)
SODIUM: 137 mmol/L (ref 135–145)

## 2014-11-16 LAB — CBC WITH DIFFERENTIAL/PLATELET
BASOS ABS: 0.1 10*3/uL (ref 0–0.1)
Basophils Relative: 1 %
EOS PCT: 3 %
Eosinophils Absolute: 0.1 10*3/uL (ref 0–0.7)
HCT: 36.3 % (ref 35.0–47.0)
HEMOGLOBIN: 12.2 g/dL (ref 12.0–16.0)
LYMPHS ABS: 1.6 10*3/uL (ref 1.0–3.6)
Lymphocytes Relative: 31 %
MCH: 33.1 pg (ref 26.0–34.0)
MCHC: 33.6 g/dL (ref 32.0–36.0)
MCV: 98.4 fL (ref 80.0–100.0)
Monocytes Absolute: 0.3 10*3/uL (ref 0.2–0.9)
Monocytes Relative: 6 %
NEUTROS PCT: 59 %
Neutro Abs: 3 10*3/uL (ref 1.4–6.5)
PLATELETS: 307 10*3/uL (ref 150–440)
RBC: 3.69 MIL/uL — AB (ref 3.80–5.20)
RDW: 12.8 % (ref 11.5–14.5)
WBC: 5.1 10*3/uL (ref 3.6–11.0)

## 2014-11-16 LAB — HEPATIC FUNCTION PANEL
ALT: 13 U/L — AB (ref 14–54)
AST: 20 U/L (ref 15–41)
Albumin: 3.9 g/dL (ref 3.5–5.0)
Alkaline Phosphatase: 77 U/L (ref 38–126)
Bilirubin, Direct: <0.1 mg/dL — ABNORMAL LOW (ref 0.1–0.5)
TOTAL PROTEIN: 7.1 g/dL (ref 6.5–8.1)
Total Bilirubin: 0.5 mg/dL (ref 0.3–1.2)

## 2014-11-16 LAB — IRON AND TIBC
Iron: 47 ug/dL (ref 28–170)
SATURATION RATIOS: 12 % (ref 10.4–31.8)
TIBC: 378 ug/dL (ref 250–450)
UIBC: 331 ug/dL

## 2014-11-16 LAB — FERRITIN: FERRITIN: 16 ng/mL (ref 11–307)

## 2014-11-25 IMAGING — MG MM DIGITAL SCREENING BILAT W/ CAD
5 series · 5 of 5 positions shown · non-contrast
Comparison: Previous exam(s).

CLINICAL DATA: Screening.

EXAM:
DIGITAL SCREENING BILATERAL MAMMOGRAM WITH CAD

[R MLO (1 of 2)]
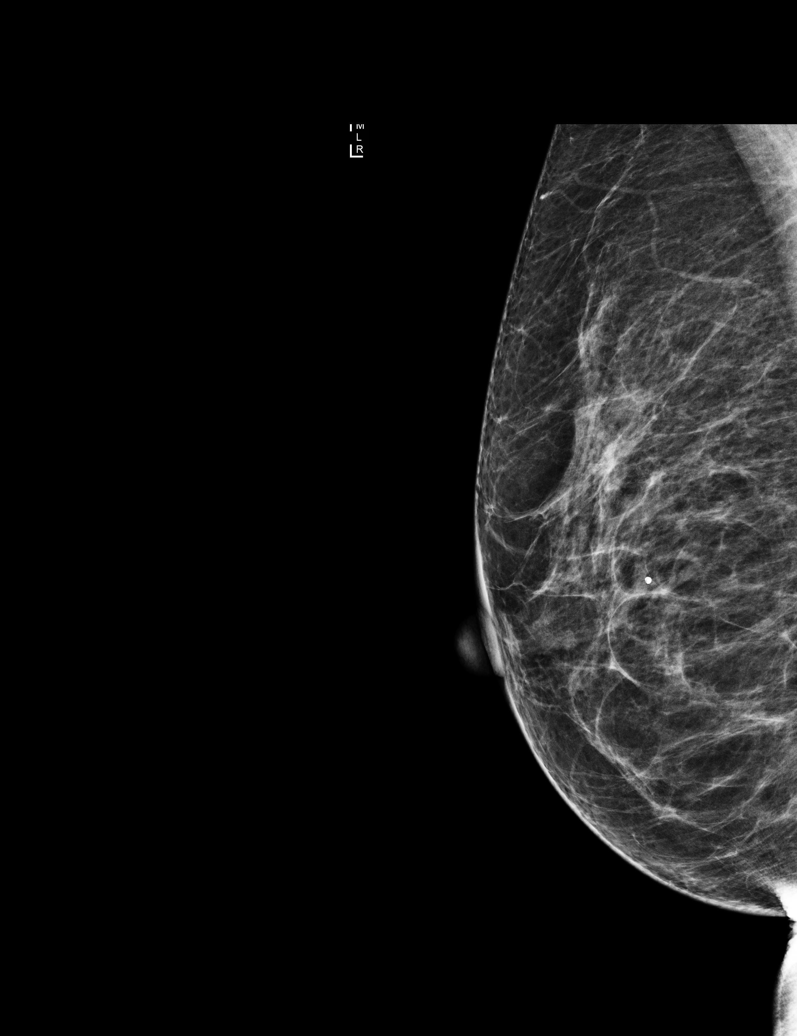

[R CC]
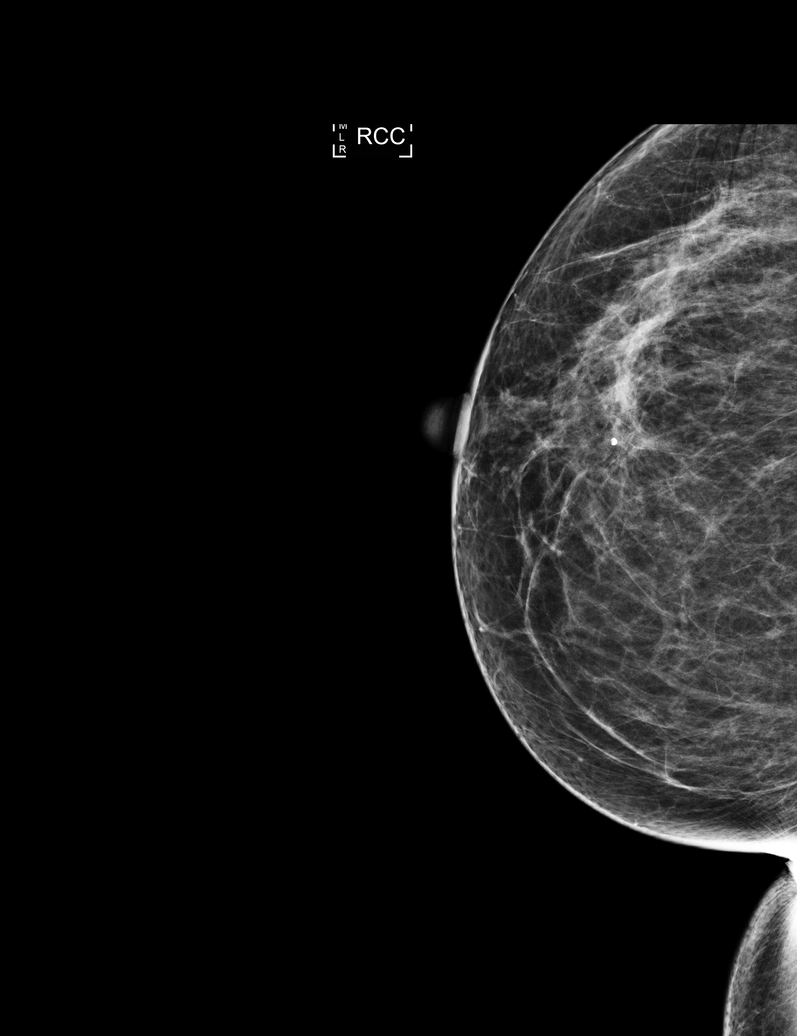

[R MLO (2 of 2)]
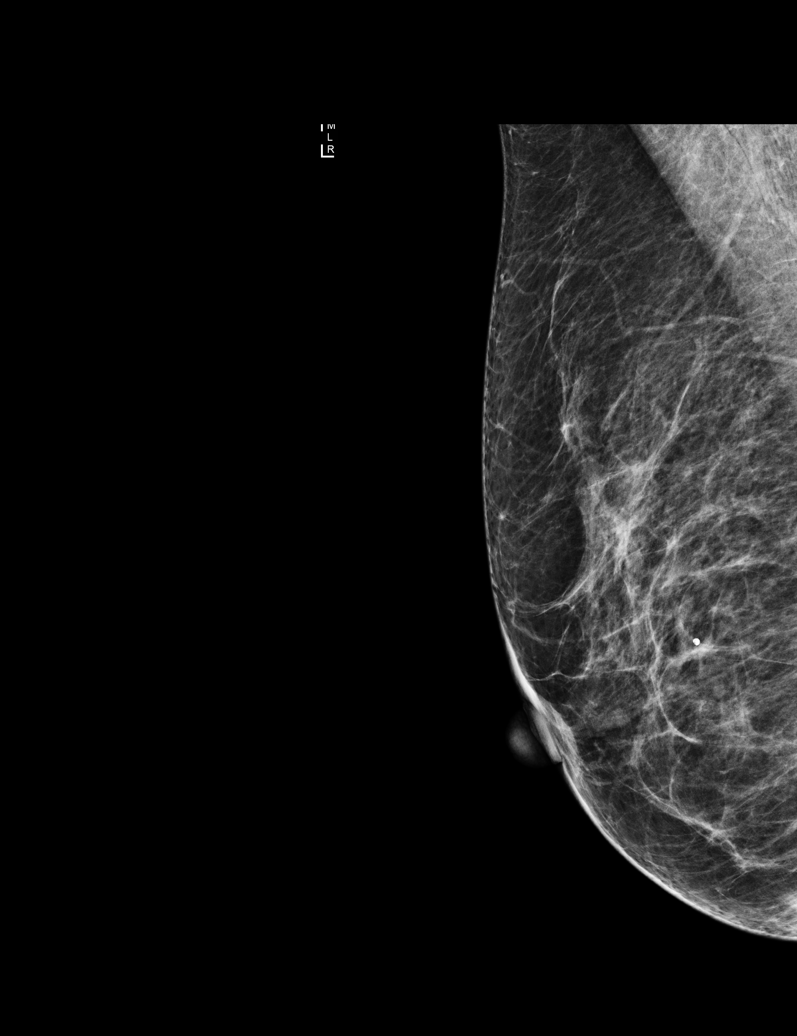

[L MLO]
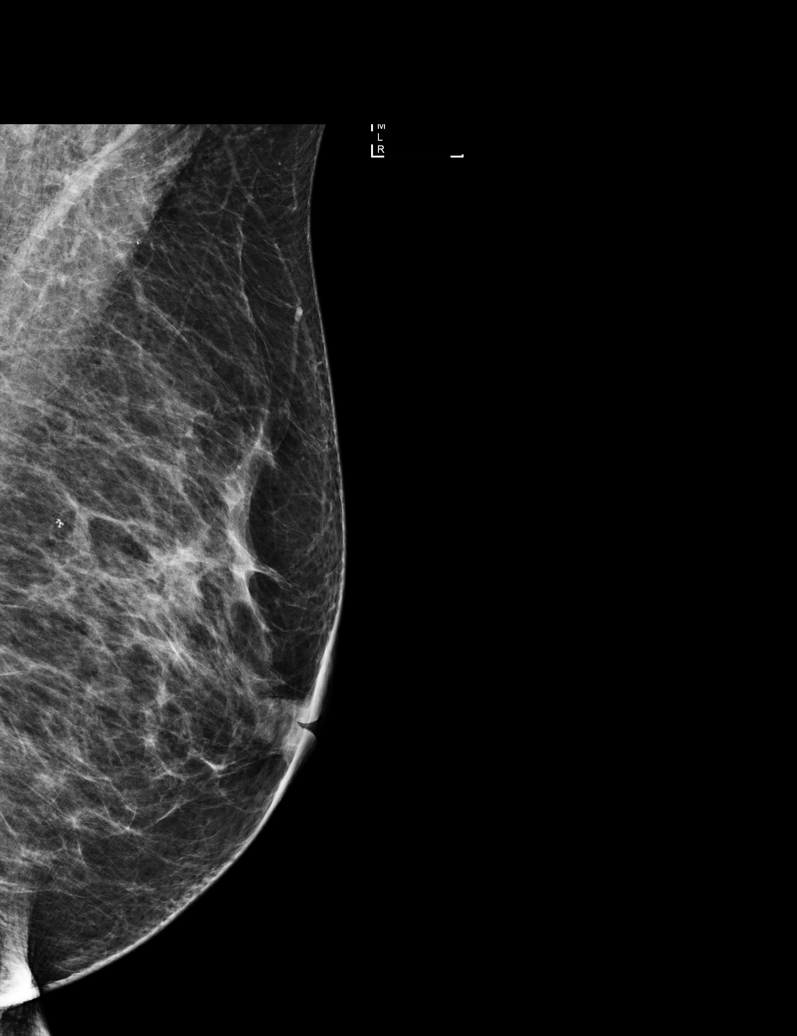

[L CC]
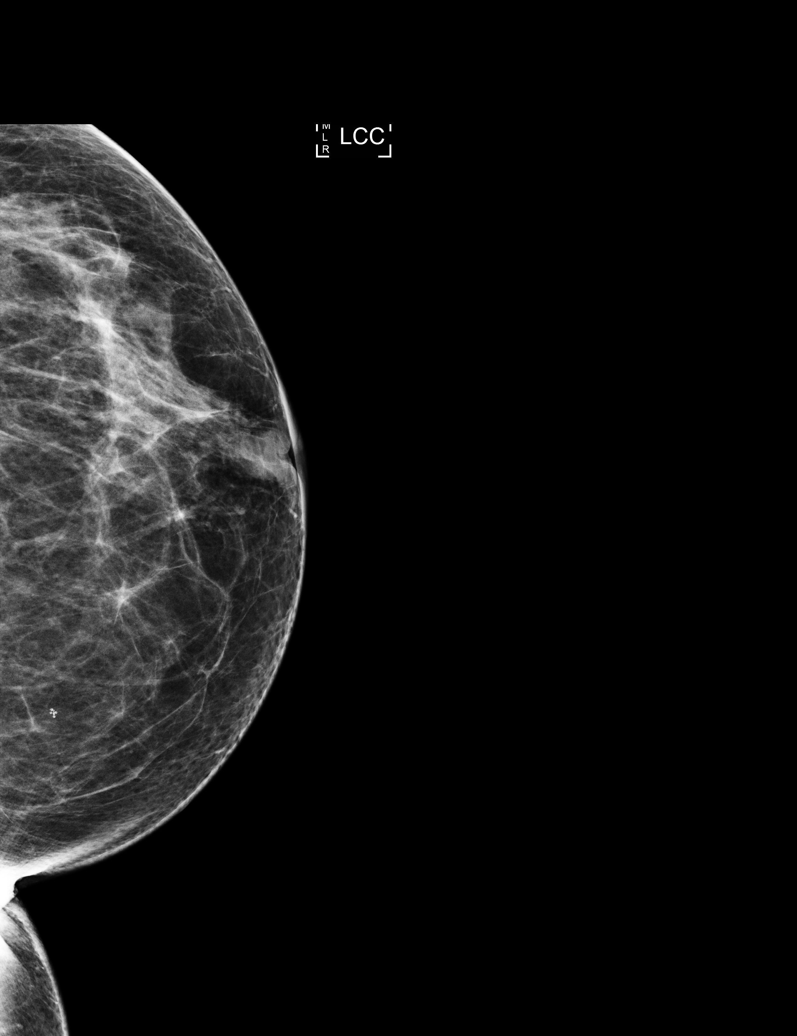

[5 of 5 positions shown; findings below may reference images not displayed]

ACR Breast Density Category c: The breast tissue is heterogeneously
dense, which may obscure small masses.
FINDINGS: There are no findings suspicious for malignancy. Images were
processed with CAD.
IMPRESSION: No mammographic evidence of malignancy. A result letter of this
screening mammogram will be mailed directly to the patient.

RECOMMENDATION:
Screening mammogram in one year. (Code:YJ-2-FEZ)

BI-RADS CATEGORY  1: Negative.

## 2014-12-02 NOTE — Progress Notes (Signed)
[facility]  [phone] [phone]     ID: VONETTE GROSSO OB: [birth year]  MR#: [id number]  CSN#:[id number]  Patient Care Team: Rubbie Battiest, NP as PCP - General (Nurse Practitioner)  CHIEF COMPLAINT/DIAGNOSIS:  Essential Thrombocytosis   (presented with Progressive Thrombocytosis, along with Anemia and mild Leukocytosis. Labs done 10/14/13 reports platelets 1576K, hemoglobin 9.9, WBC 12300, ESR 103. On 10/12/13 labs showed platelet count 1315K, Hb 9.9, MCV 87, WBC 11700, 79% neutrophils, ANC 9360, 10% lymphocytes, ALC 1160, 7% monocytes, AMC 820, 3% eosinophils, and 0.6% basophils. Labs on 10/02/13 showed WBC 10000 with similar differential, Hb 11.3, platelets 659K, potassium 4.0, Cr 0.7, Ca 8.9, LFT unremarkable, albumin 3.4).  -  patient on Hydrea.   HISTORY OF PRESENT ILLNESS:  Patient returns for continued hematology followup, she was seen few months ago. States that she is doing steady, tolerating current dose of Hydrea. No mouth sores. No dizziness. No nausea, vomiting or diarrhea. Has chronic fatigue on exertion. She denies any known history of deep venous thrombosis, pulmonary embolism, TIA, stroke, or heart attack.  No new bone pains otherwise. No paresthesias in extremities. Appetite is good. No bleeding symptoms.  REVIEW OF SYSTEMS:   ROS As in HPI above. In addition, no fever, chills or sweats. No new headaches or focal weakness.  No new sore throat, cough, shortness of breath, sputum, hemoptysis or chest pain. No dizziness or palpitation. No abdominal pain, constipation, diarrhea, dysuria or hematuria. No new skin rash or bleeding symptoms. No new paresthesias in extremities.   PAST MEDICAL HISTORY: Reviewed. Past Medical History  Diagnosis Date  . Essential thrombocytosis   . Hyperlipidemia   . Allergy   . Asthma   . Anemia   . Glaucoma   . Diverticulosis     PAST SURGICAL HISTORY: Reviewed. As above.  FAMILY HISTORY:  Reviewed. Family History  Problem Relation Age of Onset  . Hypertension Mother   . Hypertension Father   . Heart disease Father     SOCIAL HISTORY: Reviewed. Social History  Substance Use Topics  . Smoking status: Never Smoker   . Smokeless tobacco: Never Used  . Alcohol Use: No    Allergies  Allergen Reactions  . Aspirin     Other reaction(s): Unknown  . Beta Adrenergic Blockers Other (See Comments)  . Codeine Sulfate     Other reaction(s): Unknown  . Paroxetine Hcl Other (See Comments)    Current Outpatient Prescriptions  Medication Sig Dispense Refill  . acetaminophen (TYLENOL) 500 MG tablet Take by mouth.    Marland Kitchen aspirin EC 81 MG tablet Take by mouth.    . hydroxyurea (HYDREA) 500 MG capsule Take by mouth.    . latanoprost (XALATAN) 0.005 % ophthalmic solution Apply to eye.    . polyethylene glycol powder (GLYCOLAX/MIRALAX) powder Take by mouth.     No current facility-administered medications for this visit.    PHYSICAL EXAM: Filed Vitals:   11/16/14 1633  BP: 108/71  Pulse: 69  Temp: 97.4 F (36.3 C)  Resp: 18     Body mass index is 23.33 kg/(m^2).    GENERAL: Alert and oriented and in no acute distress. No icterus. HEENT: EOMs intact. No cervical lymphadenopathy. CVS: S1S2, regular LUNGS: Bilaterally clear to auscultation, no rhonchi. ABDOMEN: Soft, nontender. No hepatosplenomegaly clinically.  NEURO: grossly nonfocal, cranial nerves are intact. Gait unremarkable. EXTREMITIES: No pedal edema.   LAB RESULTS:    Component Value Date/Time   NA 137 11/16/2014  1539   NA 140 03/09/2014 1517   K 4.1 11/16/2014 1539   K 4.1 03/09/2014 1517   CL 105 11/16/2014 1539   CL 104 03/09/2014 1517   CO2 30 11/16/2014 1539   CO2 30 03/09/2014 1517   GLUCOSE 85 11/16/2014 1539   GLUCOSE 85 03/09/2014 1517   BUN 16 11/16/2014 1539   BUN 19* 03/09/2014 1517   CREATININE 0.70 11/16/2014 1539   CREATININE 0.76 03/09/2014 1517   CALCIUM 8.4* 11/16/2014 1539    CALCIUM 8.4* 03/09/2014 1517   PROT 7.1 11/16/2014 1539   PROT 6.6 03/09/2014 1517   ALBUMIN 3.9 11/16/2014 1539   ALBUMIN 3.3* 03/09/2014 1517   AST 20 11/16/2014 1539   AST 16 03/09/2014 1517   ALT 13* 11/16/2014 1539   ALT 22 03/09/2014 1517   ALKPHOS 77 11/16/2014 1539   ALKPHOS 101 03/09/2014 1517   BILITOT 0.5 11/16/2014 1539   BILITOT 0.2 03/09/2014 1517   GFRNONAA >60 11/16/2014 1539   GFRNONAA >60 03/09/2014 1517   GFRNONAA >60 11/30/2013 1502   GFRAA >60 11/16/2014 1539   GFRAA >60 03/09/2014 1517   GFRAA >60 11/30/2013 1502    Lab Results  Component Value Date   WBC 5.1 11/16/2014   NEUTROABS 3.0 11/16/2014   HGB 12.2 11/16/2014   HCT 36.3 11/16/2014   MCV 98.4 11/16/2014   PLT 307 11/16/2014    Lab Results  Component Value Date   IRON 47 11/16/2014   10/14/13 - platelets 1576K, Hb 9.9, WBC 12300, ESR 103.  10/12/13 - platelet count 1315K, Hb 9.9, MCV 87, WBC 11700, 79% neutrophils, ANC 9360, 10% lymphs, ALC 1160, 7% monos, AMC 820, 3% eos, 0.6% basophils.  10/02/13 - WBC 10000 with similar differential, Hb 11.3, platelets 659, potassium 4.0, Cr 0.7, Ca 8.9, LFT unremarkable, albumin 3.4.    ASSESSMENT / PLAN:   1. Essential Thrombocytosis  (presented with Progressive Thrombocytosis, along with Anemia and mild Leukocytosis. Labs done 10/14/13 reports platelets 1576K, Hb 9.9, WBC 12300, ESR 103. On 10/12/13 labs showed platelet count 1315K, Hb 9.9, MCV 87, WBC 11700, 79% neutrophils, ANC 9360, 10% lymphos, 0.6% basophils. Labs on 10/02/13 showed WBC 10K with similar differential, Hb 11.3, platelets 659K, potassium 4. Cr 0.7, Ca 8.9, LFT unremarkable). Bone marrow biopsy c/w Essential Thrombocytosis -  have reviewed labs and d/w patient in detail. Platelet count is under control and maintains in normal range. She is on Hydrea for essential thrombocytosis. Patient wants to take as less Hydrea as possible, plan is to decrease Hydrea to 500 mg p.o. daily on 4 days of the  week (none on Mon Wed Fri) and monitor. Will get CBC monitored once every 4 weeks, met-B and LFT at 12 weeks. Next MD follow up at 24 weeks with repeat labs and make further plan of management. 2. Anemia - Hb better today, will monitor. 3. Macrocytosis  - secondary to Hydrea effect. 4. In between visits, patient advised to call in case of any new side effects from [facility], new symptoms or sickness. Patient is agreeable to this plan.      Leia Alf, MD   12/02/2014 8:54 AM

## 2014-12-06 ENCOUNTER — Ambulatory Visit (INDEPENDENT_AMBULATORY_CARE_PROVIDER_SITE_OTHER): Payer: Medicare Other | Admitting: Nurse Practitioner

## 2014-12-06 ENCOUNTER — Encounter: Payer: Self-pay | Admitting: Nurse Practitioner

## 2014-12-06 VITALS — BP 106/78 | HR 73 | Temp 98.2°F | Resp 14 | Ht 66.0 in | Wt 142.8 lb

## 2014-12-06 DIAGNOSIS — Z227 Latent tuberculosis: Secondary | ICD-10-CM

## 2014-12-06 DIAGNOSIS — Z1239 Encounter for other screening for malignant neoplasm of breast: Secondary | ICD-10-CM

## 2014-12-06 DIAGNOSIS — K5730 Diverticulosis of large intestine without perforation or abscess without bleeding: Secondary | ICD-10-CM | POA: Diagnosis not present

## 2014-12-06 DIAGNOSIS — H409 Unspecified glaucoma: Secondary | ICD-10-CM

## 2014-12-06 DIAGNOSIS — R7611 Nonspecific reaction to tuberculin skin test without active tuberculosis: Secondary | ICD-10-CM

## 2014-12-06 DIAGNOSIS — Z7689 Persons encountering health services in other specified circumstances: Secondary | ICD-10-CM | POA: Insufficient documentation

## 2014-12-06 DIAGNOSIS — D473 Essential (hemorrhagic) thrombocythemia: Secondary | ICD-10-CM | POA: Insufficient documentation

## 2014-12-06 DIAGNOSIS — Z7189 Other specified counseling: Secondary | ICD-10-CM

## 2014-12-06 NOTE — Progress Notes (Signed)
Pre visit review using our clinic review tool, if applicable. No additional management support is needed unless otherwise documented below in the visit note. 

## 2014-12-06 NOTE — Progress Notes (Signed)
Patient ID: Tina Cook, female    DOB: 10-14-1945  Age: 68 y.o. MRN: [id number]  CC: Establish Care   HPI Tina Cook presents for establishing care and CC of mammogram referral.   1) New pt info:  Immunizations- Not UTD tdap, pneumonia- one when she turned 21, doesn't remember which one.   Mammogram- 2015   Pap- 2015 abnormal in past (35 yrs ago)  Bone Density- within 5 years   Colonoscopy- 2015, 2 polyps, repeat in 5 years   Eye Exam- 11/24/14  LMP- 2002  2) Chronic Problems-  Diverticulosis- no concerns  Asthma- No inhaler in years   Glaucoma- Small cataracts, good eye exam   Latent TB- going through health department   Essential thrombocytosis- Dr. Ma Hillock, blood work has been stable since last year.   600 mg of calcium daily    3) Acute Problems-  Wants screening for breast cancer. Mammogram ordered at [facility].  [facility]   History Tina Cook has a past medical history of Essential thrombocytosis; Hyperlipidemia; Allergy; Asthma; Anemia; Glaucoma; Diverticulosis; Chicken pox; Diverticulitis; Colon polyps; Positive QuantiFERON-TB Gold test; and Essential thrombocythemia.   She has past surgical history that includes Trigger finger release (Bilateral, 2005, 2007, 2009).   Her family history includes Cancer in her sister; Depression in her paternal grandmother; Diabetes in her sister; Heart disease in her brother, father, and sister; Hypertension in her father, mother, and sister; Stroke in her mother and sister.She reports that she has never smoked. She has never used smokeless tobacco. She reports that she does not drink alcohol or use illicit drugs.  Outpatient Prescriptions Prior to Visit  Medication Sig Dispense Refill  . acetaminophen (TYLENOL) 500 MG tablet Take by mouth.    Marland Kitchen aspirin EC 81 MG tablet Take by mouth.    . hydroxyurea (HYDREA) 500 MG capsule Take by mouth.    . latanoprost (XALATAN) 0.005 % ophthalmic solution Apply to eye.    .  polyethylene glycol powder (GLYCOLAX/MIRALAX) powder Take by mouth.     No facility-administered medications prior to visit.    ROS Review of Systems  Constitutional: Negative for fever, chills, diaphoresis and fatigue.  HENT: Negative for tinnitus and trouble swallowing.   Respiratory: Negative for chest tightness, shortness of breath and wheezing.   Cardiovascular: Negative for chest pain, palpitations and leg swelling.  Gastrointestinal: Positive for constipation. Negative for nausea, vomiting and diarrhea.  Genitourinary: Negative for difficulty urinating.  Skin: Negative for rash.  Neurological: Negative for dizziness, weakness, numbness and headaches.  Psychiatric/Behavioral: Negative for suicidal ideas and sleep disturbance. The patient is not nervous/anxious.     Objective:  BP 106/78 mmHg  Pulse 73  Temp(Src) 98.2 F (36.8 C)  Resp 14  Ht 5\' 6"  (1.676 m)  Wt 142 lb 12.8 oz (64.774 kg)  BMI 23.06 kg/m2  SpO2 98%  Physical Exam  Constitutional: She is oriented to person, place, and time. She appears well-developed and well-nourished. No distress.  HENT:  Head: Normocephalic and atraumatic.  Right Ear: External ear normal.  Left Ear: External ear normal.  Cardiovascular: Normal rate, regular rhythm and normal heart sounds.   Pulmonary/Chest: Effort normal and breath sounds normal. No respiratory distress. She has no wheezes. She has no rales. She exhibits no tenderness.  Neurological: She is alert and oriented to person, place, and time. No cranial nerve deficit. She exhibits normal muscle tone. Coordination normal.  Skin: Skin is warm and dry. No rash noted.  She is not diaphoretic.  Psychiatric: She has a normal mood and affect. Her behavior is normal. Judgment and thought content normal.   Assessment & Plan:   Tina Cook was seen today for establish care.  Diagnoses and all orders for this visit:  Screening for breast cancer -     MM Digital Screening;  Future  Essential thrombocytosis  Glaucoma  TB lung, latent  Diverticulosis of colon without hemorrhage  Encounter to establish care   I am having Tina Cook maintain her acetaminophen, aspirin EC, hydroxyurea, latanoprost, polyethylene glycol powder, and calcium carbonate.  Meds ordered this encounter  Medications  . calcium carbonate (OSCAL) 1500 (600 CA) MG TABS tablet    Sig: Take by mouth 2 (two) times daily with a meal.     Follow-up: Return in about 6 months (around 06/05/2015) for Follow up.

## 2014-12-06 NOTE — Assessment & Plan Note (Signed)
Pt is working with health department for treatment with 2 positive quantiferon gold tests

## 2014-12-06 NOTE — Assessment & Plan Note (Signed)
Colonoscopy 2015. Repeat in 5 years

## 2014-12-06 NOTE — Assessment & Plan Note (Signed)
Stable. Gets regular eye exams. Eye drops helpful.

## 2014-12-06 NOTE — Assessment & Plan Note (Signed)
Mammogram referral placed. No specific concerns. Pt wants screening

## 2014-12-06 NOTE — Assessment & Plan Note (Signed)
Seeing Dr. Ma Hillock. Normal plt over last year. Pt reports she is happy with the progress

## 2014-12-06 NOTE — Patient Instructions (Signed)
[place] [phone] To schedule your appointment for a mammogram Hours at [facility]: Monday - Thursday: 8 a.m. - 5 p.m Friday: 8 a.m. - 3 p.m.   Welcome to [facility]! Nice to meet you.

## 2014-12-06 NOTE — Assessment & Plan Note (Signed)
Discussed acute and chronic issues. Reviewed health maintenance measures, PFSHx, and immunizations. Obtain records from previous facility.   

## 2014-12-14 ENCOUNTER — Inpatient Hospital Stay: Payer: Medicare Other | Attending: Internal Medicine

## 2014-12-14 DIAGNOSIS — D7589 Other specified diseases of blood and blood-forming organs: Secondary | ICD-10-CM | POA: Insufficient documentation

## 2014-12-14 DIAGNOSIS — D72829 Elevated white blood cell count, unspecified: Secondary | ICD-10-CM | POA: Insufficient documentation

## 2014-12-14 DIAGNOSIS — D473 Essential (hemorrhagic) thrombocythemia: Secondary | ICD-10-CM | POA: Insufficient documentation

## 2014-12-14 DIAGNOSIS — D649 Anemia, unspecified: Secondary | ICD-10-CM | POA: Insufficient documentation

## 2014-12-14 LAB — CBC WITH DIFFERENTIAL/PLATELET
Basophils Absolute: 0.1 10*3/uL (ref 0–0.1)
Basophils Relative: 1 %
EOS ABS: 0.2 10*3/uL (ref 0–0.7)
Eosinophils Relative: 3 %
HCT: 36.6 % (ref 35.0–47.0)
HEMOGLOBIN: 12.2 g/dL (ref 12.0–16.0)
LYMPHS ABS: 1.5 10*3/uL (ref 1.0–3.6)
LYMPHS PCT: 28 %
MCH: 32.5 pg (ref 26.0–34.0)
MCHC: 33.3 g/dL (ref 32.0–36.0)
MCV: 97.6 fL (ref 80.0–100.0)
MONOS PCT: 6 %
Monocytes Absolute: 0.3 10*3/uL (ref 0.2–0.9)
NEUTROS PCT: 62 %
Neutro Abs: 3.2 10*3/uL (ref 1.4–6.5)
Platelets: 306 10*3/uL (ref 150–440)
RBC: 3.75 MIL/uL — AB (ref 3.80–5.20)
RDW: 12.6 % (ref 11.5–14.5)
WBC: 5.2 10*3/uL (ref 3.6–11.0)

## 2015-01-11 ENCOUNTER — Inpatient Hospital Stay: Payer: Medicare Other | Attending: Internal Medicine

## 2015-01-11 DIAGNOSIS — D649 Anemia, unspecified: Secondary | ICD-10-CM | POA: Insufficient documentation

## 2015-01-11 DIAGNOSIS — D7589 Other specified diseases of blood and blood-forming organs: Secondary | ICD-10-CM | POA: Insufficient documentation

## 2015-01-11 DIAGNOSIS — D473 Essential (hemorrhagic) thrombocythemia: Secondary | ICD-10-CM | POA: Diagnosis present

## 2015-01-11 DIAGNOSIS — D72829 Elevated white blood cell count, unspecified: Secondary | ICD-10-CM | POA: Insufficient documentation

## 2015-01-11 LAB — CBC WITH DIFFERENTIAL/PLATELET
BASOS PCT: 1 %
Basophils Absolute: 0.1 10*3/uL (ref 0–0.1)
EOS ABS: 0.2 10*3/uL (ref 0–0.7)
Eosinophils Relative: 3 %
HEMATOCRIT: 36.9 % (ref 35.0–47.0)
HEMOGLOBIN: 12.2 g/dL (ref 12.0–16.0)
LYMPHS ABS: 1.6 10*3/uL (ref 1.0–3.6)
Lymphocytes Relative: 28 %
MCH: 32.4 pg (ref 26.0–34.0)
MCHC: 33 g/dL (ref 32.0–36.0)
MCV: 98.1 fL (ref 80.0–100.0)
MONOS PCT: 4 %
Monocytes Absolute: 0.2 10*3/uL (ref 0.2–0.9)
NEUTROS ABS: 3.7 10*3/uL (ref 1.4–6.5)
NEUTROS PCT: 64 %
Platelets: 335 10*3/uL (ref 150–440)
RBC: 3.76 MIL/uL — AB (ref 3.80–5.20)
RDW: 12.5 % (ref 11.5–14.5)
WBC: 5.8 10*3/uL (ref 3.6–11.0)

## 2015-01-13 ENCOUNTER — Other Ambulatory Visit: Payer: Self-pay | Admitting: Internal Medicine

## 2015-01-13 NOTE — Telephone Encounter (Signed)
Med refilled e scribed. Dr Rogue Bussing wants to see pt in Dec when she has lab appt to get acquainted with her. I have called and left message with pt to return my call

## 2015-02-08 ENCOUNTER — Inpatient Hospital Stay: Payer: Medicare Other | Attending: Internal Medicine

## 2015-02-08 DIAGNOSIS — D72829 Elevated white blood cell count, unspecified: Secondary | ICD-10-CM | POA: Insufficient documentation

## 2015-02-08 DIAGNOSIS — D7589 Other specified diseases of blood and blood-forming organs: Secondary | ICD-10-CM | POA: Insufficient documentation

## 2015-02-08 DIAGNOSIS — D473 Essential (hemorrhagic) thrombocythemia: Secondary | ICD-10-CM | POA: Insufficient documentation

## 2015-02-08 DIAGNOSIS — D649 Anemia, unspecified: Secondary | ICD-10-CM | POA: Insufficient documentation

## 2015-02-15 ENCOUNTER — Inpatient Hospital Stay: Payer: Medicare Other

## 2015-02-15 DIAGNOSIS — D72829 Elevated white blood cell count, unspecified: Secondary | ICD-10-CM | POA: Diagnosis not present

## 2015-02-15 DIAGNOSIS — D649 Anemia, unspecified: Secondary | ICD-10-CM | POA: Diagnosis not present

## 2015-02-15 DIAGNOSIS — D473 Essential (hemorrhagic) thrombocythemia: Secondary | ICD-10-CM

## 2015-02-15 DIAGNOSIS — D7589 Other specified diseases of blood and blood-forming organs: Secondary | ICD-10-CM | POA: Diagnosis not present

## 2015-02-15 LAB — CREATININE, SERUM
Creatinine, Ser: 0.74 mg/dL (ref 0.44–1.00)
GFR calc Af Amer: >60 mL/min (ref >60)
GFR calc non Af Amer: >60 mL/min (ref >60)

## 2015-02-15 LAB — CBC WITH DIFFERENTIAL/PLATELET
Basophils Absolute: 0.1 10*3/uL (ref 0–0.1)
Basophils Relative: 1 %
Eosinophils Absolute: 0.2 10*3/uL (ref 0–0.7)
Eosinophils Relative: 3 %
HEMATOCRIT: 37.2 % (ref 35.0–47.0)
HEMOGLOBIN: 12.4 g/dL (ref 12.0–16.0)
LYMPHS ABS: 1.6 10*3/uL (ref 1.0–3.6)
LYMPHS PCT: 30 %
MCH: 32.1 pg (ref 26.0–34.0)
MCHC: 33.2 g/dL (ref 32.0–36.0)
MCV: 96.9 fL (ref 80.0–100.0)
MONOS PCT: 4 %
Monocytes Absolute: 0.2 10*3/uL (ref 0.2–0.9)
NEUTROS ABS: 3.4 10*3/uL (ref 1.4–6.5)
NEUTROS PCT: 62 %
Platelets: 359 10*3/uL (ref 150–440)
RBC: 3.84 MIL/uL (ref 3.80–5.20)
RDW: 12.8 % (ref 11.5–14.5)
WBC: 5.5 10*3/uL (ref 3.6–11.0)

## 2015-02-15 LAB — HEPATIC FUNCTION PANEL
ALT: 13 U/L — AB (ref 14–54)
AST: 22 U/L (ref 15–41)
Albumin: 3.9 g/dL (ref 3.5–5.0)
Alkaline Phosphatase: 78 U/L (ref 38–126)
BILIRUBIN DIRECT: 0.1 mg/dL (ref 0.1–0.5)
BILIRUBIN TOTAL: 0.6 mg/dL (ref 0.3–1.2)
Indirect Bilirubin: 0.5 mg/dL (ref 0.3–0.9)
Total Protein: 6.8 g/dL (ref 6.5–8.1)

## 2015-03-03 ENCOUNTER — Other Ambulatory Visit: Payer: Self-pay | Admitting: Nurse Practitioner

## 2015-03-03 ENCOUNTER — Ambulatory Visit
Admission: RE | Admit: 2015-03-03 | Discharge: 2015-03-03 | Disposition: A | Discharge status: Home / Self Care | Payer: Medicare Other | Source: Ambulatory Visit | Attending: Nurse Practitioner | Admitting: Nurse Practitioner

## 2015-03-03 DIAGNOSIS — Z1239 Encounter for other screening for malignant neoplasm of breast: Secondary | ICD-10-CM

## 2015-03-03 DIAGNOSIS — Z1231 Encounter for screening mammogram for malignant neoplasm of breast: Secondary | ICD-10-CM | POA: Insufficient documentation

## 2015-03-08 ENCOUNTER — Other Ambulatory Visit: Payer: Self-pay | Admitting: *Deleted

## 2015-03-08 ENCOUNTER — Inpatient Hospital Stay: Payer: Medicare Other

## 2015-03-08 DIAGNOSIS — D473 Essential (hemorrhagic) thrombocythemia: Secondary | ICD-10-CM

## 2015-03-09 ENCOUNTER — Inpatient Hospital Stay (HOSPITAL_BASED_OUTPATIENT_CLINIC_OR_DEPARTMENT_OTHER): Payer: Medicare Other | Admitting: Internal Medicine

## 2015-03-09 ENCOUNTER — Inpatient Hospital Stay: Payer: Medicare Other | Attending: Internal Medicine

## 2015-03-09 VITALS — BP 114/78 | HR 76 | Temp 98.1°F | Ht 66.0 in | Wt 142.9 lb

## 2015-03-09 DIAGNOSIS — Z7982 Long term (current) use of aspirin: Secondary | ICD-10-CM | POA: Diagnosis not present

## 2015-03-09 DIAGNOSIS — Z87898 Personal history of other specified conditions: Secondary | ICD-10-CM | POA: Insufficient documentation

## 2015-03-09 DIAGNOSIS — D473 Essential (hemorrhagic) thrombocythemia: Secondary | ICD-10-CM

## 2015-03-09 DIAGNOSIS — J45909 Unspecified asthma, uncomplicated: Secondary | ICD-10-CM | POA: Insufficient documentation

## 2015-03-09 DIAGNOSIS — Z803 Family history of malignant neoplasm of breast: Secondary | ICD-10-CM | POA: Insufficient documentation

## 2015-03-09 DIAGNOSIS — E785 Hyperlipidemia, unspecified: Secondary | ICD-10-CM

## 2015-03-09 DIAGNOSIS — Z79899 Other long term (current) drug therapy: Secondary | ICD-10-CM

## 2015-03-09 LAB — CBC WITH DIFFERENTIAL/PLATELET
Basophils Absolute: 0.1 10*3/uL (ref 0–0.1)
Basophils Relative: 2 %
Eosinophils Absolute: 0.2 10*3/uL (ref 0–0.7)
Eosinophils Relative: 4 %
HEMATOCRIT: 36.3 % (ref 35.0–47.0)
HEMOGLOBIN: 12.2 g/dL (ref 12.0–16.0)
LYMPHS PCT: 31 %
Lymphs Abs: 1.5 10*3/uL (ref 1.0–3.6)
MCH: 32.6 pg (ref 26.0–34.0)
MCHC: 33.6 g/dL (ref 32.0–36.0)
MCV: 96.9 fL (ref 80.0–100.0)
MONOS PCT: 7 %
Monocytes Absolute: 0.4 10*3/uL (ref 0.2–0.9)
NEUTROS ABS: 2.8 10*3/uL (ref 1.4–6.5)
NEUTROS PCT: 56 %
Platelets: 312 10*3/uL (ref 150–440)
RBC: 3.74 MIL/uL — AB (ref 3.80–5.20)
RDW: 12.7 % (ref 11.5–14.5)
WBC: 4.9 10*3/uL (ref 3.6–11.0)

## 2015-03-09 NOTE — Progress Notes (Signed)
[facility] OFFICE PROGRESS NOTE  Patient Care Team: Rubbie Battiest, NP as PCP - General (Nurse Practitioner)   SUMMARY OF ONCOLOGIC HISTORY:  # July 2015- ESSENTIAL THROMBOCYTOSIS [platelets- 1.5Mil; BMBx- megakaryocytosis s/o ET; bcr-abl/jak-2/MPL/CALR-NEG] [Hydrea 500 4 times a week]; DEC 2016- Hydrea reduced to 3 times a week  # Hx of Iron def [July 2015- Ferritin-8]; colo- march 2015-polyp/no bleeding. RMSF [summer 2015]  INTERVAL HISTORY:  This is my first interaction with the patient since I joined the practice September 2016. I reviewed the patient's prior charts/pertinent labs/imaging in detail; findings are summarized above.   A very pleasant 69 year old female patient diagnosed with essential thrombocytosis in July 2016 is here for follow-up. Patient Hydrea 500 mg 4 times a week. Appetite is good. No weight loss. No night sweats. No unusual tingling and numbness of her energy.  Denies any chest pain or shortness of breath or cough. She recently has been diagnosed with latent TB; she is inquiring about use of prophylactic rifampin.   REVIEW OF SYSTEMS:  A complete 10 point review of system is done which is negative except mentioned above/history of present illness.   PAST MEDICAL HISTORY :  Past Medical History  Diagnosis Date  . Essential thrombocytosis ([place])   . Hyperlipidemia   . Allergy   . Asthma   . Anemia   . Glaucoma   . Diverticulosis   . Chicken pox   . Diverticulitis   . Colon polyps   . Positive QuantiFERON-TB Gold test   . Essential thrombocythemia ([place])     PAST SURGICAL HISTORY :   Past Surgical History  Procedure Laterality Date  . Trigger finger release Bilateral 2005, 2007, 2009    Thumbs and another finger    FAMILY HISTORY :   Family History  Problem Relation Age of Onset  . Hypertension Mother   . Stroke Mother   . Hypertension Father   . Heart disease Father   . Hypertension Sister   . Heart disease Sister   .  Stroke Sister   . Diabetes Sister   . Breast cancer Sister 40  . Heart disease Brother   . Depression Paternal Grandmother   . Breast cancer Paternal Aunt     SOCIAL HISTORY:   Social History  Substance Use Topics  . Smoking status: Never Smoker   . Smokeless tobacco: Never Used  . Alcohol Use: No    ALLERGIES:  is allergic to aspirin; beta adrenergic blockers; codeine sulfate; and paroxetine hcl.  MEDICATIONS:  Current Outpatient Prescriptions  Medication Sig Dispense Refill  . acetaminophen (TYLENOL) 500 MG tablet Take by mouth.    Marland Kitchen aspirin EC 81 MG tablet Take by mouth.    . calcium carbonate (OSCAL) 1500 (600 CA) MG TABS tablet Take by mouth 2 (two) times daily with a meal.    . hydroxyurea (HYDREA) 500 MG capsule TAKE TWO CAPSULES BY MOUTH IN THE MORNING AND ONE IN THE EVENING 90 capsule 0  . latanoprost (XALATAN) 0.005 % ophthalmic solution Apply to eye.    . polyethylene glycol powder (GLYCOLAX/MIRALAX) powder Take by mouth.     No current facility-administered medications for this visit.    PHYSICAL EXAMINATION: ECOG PERFORMANCE STATUS: 0 - Asymptomatic  BP 114/78 mmHg  Pulse 76  Temp(Src) 98.1 F (36.7 C) (Tympanic)  Ht '5\' 6"'  (1.676 m)  Wt 142 lb 13.7 oz (64.8 kg)  BMI 23.07 kg/m2  Filed Weights   03/09/15 0839  Weight: 142  lb 13.7 oz (64.8 kg)    GENERAL: Well-nourished well-developed; Alert, no distress and comfortable.   Alone. EYES: no pallor or icterus OROPHARYNX: no thrush or ulceration; good dentition  NECK: supple, no masses felt LYMPH:  no palpable lymphadenopathy in the cervical, axillary or inguinal regions LUNGS: clear to auscultation and  No wheeze or crackles HEART/CVS: regular rate & rhythm and no murmurs; No lower extremity edema ABDOMEN:abdomen soft, non-tender and normal bowel sounds Musculoskeletal:no cyanosis of digits and no clubbing  PSYCH: alert & oriented x 3 with fluent speech NEURO: no focal motor/sensory deficits SKIN:  no  rashes or significant lesions  LABORATORY DATA:  I have reviewed the data as listed    Component Value Date/Time   NA 137 11/16/2014 1539   NA 140 03/09/2014 1517   K 4.1 11/16/2014 1539   K 4.1 03/09/2014 1517   CL 105 11/16/2014 1539   CL 104 03/09/2014 1517   CO2 30 11/16/2014 1539   CO2 30 03/09/2014 1517   GLUCOSE 85 11/16/2014 1539   GLUCOSE 85 03/09/2014 1517   BUN 16 11/16/2014 1539   BUN 19* 03/09/2014 1517   CREATININE 0.74 02/15/2015 1513   CREATININE 0.76 03/09/2014 1517   CALCIUM 8.4* 11/16/2014 1539   CALCIUM 8.4* 03/09/2014 1517   PROT 6.8 02/15/2015 1513   PROT 6.6 03/09/2014 1517   ALBUMIN 3.9 02/15/2015 1513   ALBUMIN 3.3* 03/09/2014 1517   AST 22 02/15/2015 1513   AST 16 03/09/2014 1517   ALT 13* 02/15/2015 1513   ALT 22 03/09/2014 1517   ALKPHOS 78 02/15/2015 1513   ALKPHOS 101 03/09/2014 1517   BILITOT 0.6 02/15/2015 1513   BILITOT 0.2 03/09/2014 1517   GFRNONAA >60 02/15/2015 1513   GFRNONAA >60 03/09/2014 1517   GFRNONAA >60 11/30/2013 1502   GFRAA >60 02/15/2015 1513   GFRAA >60 03/09/2014 1517   GFRAA >60 11/30/2013 1502    No results found for: SPEP, UPEP  Lab Results  Component Value Date   WBC 4.9 03/09/2015   NEUTROABS 2.8 03/09/2015   HGB 12.2 03/09/2015   HCT 36.3 03/09/2015   MCV 96.9 03/09/2015   PLT 312 03/09/2015      Chemistry      Component Value Date/Time   NA 137 11/16/2014 1539   NA 140 03/09/2014 1517   K 4.1 11/16/2014 1539   K 4.1 03/09/2014 1517   CL 105 11/16/2014 1539   CL 104 03/09/2014 1517   CO2 30 11/16/2014 1539   CO2 30 03/09/2014 1517   BUN 16 11/16/2014 1539   BUN 19* 03/09/2014 1517   CREATININE 0.74 02/15/2015 1513   CREATININE 0.76 03/09/2014 1517      Component Value Date/Time   CALCIUM 8.4* 11/16/2014 1539   CALCIUM 8.4* 03/09/2014 1517   ALKPHOS 78 02/15/2015 1513   ALKPHOS 101 03/09/2014 1517   AST 22 02/15/2015 1513   AST 16 03/09/2014 1517   ALT 13* 02/15/2015 1513   ALT 22  03/09/2014 1517   BILITOT 0.6 02/15/2015 1513   BILITOT 0.2 03/09/2014 1517       RADIOGRAPHIC STUDIES: I have personally reviewed the radiological images as listed and agreed with the findings in the report. No results found.   ASSESSMENT & PLAN:   # Essential thrombocytosis- currently on Hydrea. I reviewed the bone marrow biopsy report/summarized above. Discussed at length with the patient the multiple reasons for elevated platelets. Since patient had iron deficiency anemia/also "[place] spotted  fever as per patient"- if quite possible that patient had elevated platelet some secondary causes.  # Since patient is currently doing well on Hydrea; I would recommend cutting down the dose of Hydrea to 500 mg 3 times a week instead of 4 times a week. We'll check labs in 6 weeks; follow up with me in 3 months. Recommend continued baby aspirin once a day.  # If patient continues to have no worsening platelet counts while tapering off Hydrea- it's quite possible the patient has secondary thrombocytosis; rather than primary/essential thrombocytosis.  # 25 minutes face-to-face with the patient discussing the above plan of care; more than 50% of time spent on prognosis/ natural history; counseling and coordination.      Cammie Sickle, MD 03/09/2015 9:09 AM

## 2015-04-05 ENCOUNTER — Inpatient Hospital Stay: Payer: Medicare Other

## 2015-04-20 ENCOUNTER — Inpatient Hospital Stay: Payer: Medicare Other | Attending: Internal Medicine

## 2015-04-20 DIAGNOSIS — D473 Essential (hemorrhagic) thrombocythemia: Secondary | ICD-10-CM | POA: Diagnosis not present

## 2015-04-20 LAB — CBC WITH DIFFERENTIAL/PLATELET
BASOS ABS: 0.1 10*3/uL (ref 0–0.1)
Basophils Relative: 1 %
EOS PCT: 4 %
Eosinophils Absolute: 0.2 10*3/uL (ref 0–0.7)
HCT: 34.5 % — ABNORMAL LOW (ref 35.0–47.0)
Hemoglobin: 11.7 g/dL — ABNORMAL LOW (ref 12.0–16.0)
LYMPHS PCT: 31 %
Lymphs Abs: 1.9 10*3/uL (ref 1.0–3.6)
MCH: 32.1 pg (ref 26.0–34.0)
MCHC: 33.8 g/dL (ref 32.0–36.0)
MCV: 95.1 fL (ref 80.0–100.0)
Monocytes Absolute: 0.4 10*3/uL (ref 0.2–0.9)
Monocytes Relative: 6 %
NEUTROS ABS: 3.6 10*3/uL (ref 1.4–6.5)
Neutrophils Relative %: 58 %
PLATELETS: 308 10*3/uL (ref 150–440)
RBC: 3.63 MIL/uL — AB (ref 3.80–5.20)
RDW: 12.4 % (ref 11.5–14.5)
WBC: 6.3 10*3/uL (ref 3.6–11.0)

## 2015-04-21 ENCOUNTER — Telehealth: Payer: Self-pay | Admitting: *Deleted

## 2015-04-21 NOTE — Telephone Encounter (Signed)
Called. Left msg asking patient to contact the cancer center to review lab results and md instructions

## 2015-04-21 NOTE — Telephone Encounter (Signed)
-----   Message from Cammie Sickle, MD sent at 04/20/2015  4:11 PM EST ----- Please inform patient that her platelets are normal range; recommend taking Hydrea once a week. And follow up as planned. Thanks!

## 2015-04-22 NOTE — Telephone Encounter (Signed)
Pt called back. Hydrea instructions provided. Pt will take hydrea once a week. She took her dose today. I explained that she should not need another dose of hydrea until next Friday. Teach back process performed with the patient.  She will keep her apt in March with Dr. Rogue Bussing.

## 2015-05-03 ENCOUNTER — Ambulatory Visit: Payer: Medicare Other | Admitting: Internal Medicine

## 2015-05-03 ENCOUNTER — Other Ambulatory Visit: Payer: Medicare Other

## 2015-05-03 ENCOUNTER — Ambulatory Visit: Payer: Medicare Other

## 2015-06-06 ENCOUNTER — Ambulatory Visit (INDEPENDENT_AMBULATORY_CARE_PROVIDER_SITE_OTHER): Payer: Medicare Other | Admitting: Nurse Practitioner

## 2015-06-06 ENCOUNTER — Encounter: Payer: Self-pay | Admitting: Nurse Practitioner

## 2015-06-06 VITALS — BP 102/64 | HR 72 | Temp 98.0°F | Resp 14 | Ht 66.0 in | Wt 146.0 lb

## 2015-06-06 DIAGNOSIS — H409 Unspecified glaucoma: Secondary | ICD-10-CM

## 2015-06-06 DIAGNOSIS — R7611 Nonspecific reaction to tuberculin skin test without active tuberculosis: Secondary | ICD-10-CM

## 2015-06-06 DIAGNOSIS — D473 Essential (hemorrhagic) thrombocythemia: Secondary | ICD-10-CM

## 2015-06-06 DIAGNOSIS — Z227 Latent tuberculosis: Secondary | ICD-10-CM

## 2015-06-06 DIAGNOSIS — M79662 Pain in left lower leg: Secondary | ICD-10-CM

## 2015-06-06 DIAGNOSIS — K5730 Diverticulosis of large intestine without perforation or abscess without bleeding: Secondary | ICD-10-CM

## 2015-06-06 NOTE — Assessment & Plan Note (Signed)
Patient is stable currently her last appointment was in September with her next appointment in April

## 2015-06-06 NOTE — Assessment & Plan Note (Signed)
Stable no complaints no changes to diet

## 2015-06-06 NOTE — Progress Notes (Signed)
Patient ID: MERADITH BEECHY, female    DOB: Jul 12, 1945  Age: 70 y.o. MRN: [id number]  CC: Follow-up   HPI MAKENAH LEADERS presents for a 6 month follow up of TB, thrombocytosis, Diverticulosis, glaucoma ect.Marland KitchenMarland Kitchen   1) TB- last visit was having to work with the [facility] for 2 + quantiferon gold tests.   2) Diverticulosis- No concerns   3) Thrombocytosis- Hydrea 500 mg 3 x a week- sees Dr. Burlene Arnt  Recent CBC w/ diff in Feb.   4) glaucoma- patient is stable at this time she has another appointment coming up in April  History Tearza has a past medical history of Essential thrombocytosis ([place]); Hyperlipidemia; Allergy; Asthma; Anemia; Glaucoma; Diverticulosis; Chicken pox; Diverticulitis; Colon polyps; Positive QuantiFERON-TB Gold test; and Essential thrombocythemia ([place]).   She has past surgical history that includes Trigger finger release (Bilateral, 2005, 2007, 2009).   Her family history includes Breast cancer in her paternal aunt; Breast cancer (age of onset: 43) in her sister; Depression in her paternal grandmother; Diabetes in her sister; Heart disease in her brother, father, and sister; Hypertension in her father, mother, and sister; Stroke in her mother and sister.She reports that she has never smoked. She has never used smokeless tobacco. She reports that she does not drink alcohol or use illicit drugs.  Outpatient Prescriptions Prior to Visit  Medication Sig Dispense Refill  . acetaminophen (TYLENOL) 500 MG tablet Take by mouth.    Marland Kitchen aspirin EC 81 MG tablet Take by mouth.    . calcium carbonate (OSCAL) 1500 (600 CA) MG TABS tablet Take by mouth 2 (two) times daily with a meal.    . hydroxyurea (HYDREA) 500 MG capsule TAKE TWO CAPSULES BY MOUTH IN THE MORNING AND ONE IN THE EVENING (Patient taking differently: 1 tablet weekly) 90 capsule 0  . latanoprost (XALATAN) 0.005 % ophthalmic solution Apply to eye.    . polyethylene glycol powder (GLYCOLAX/MIRALAX) powder Take by mouth.      No facility-administered medications prior to visit.    ROS Review of Systems  Constitutional: Negative for fever, chills, diaphoresis and fatigue.  Respiratory: Negative for chest tightness, shortness of breath and wheezing.   Cardiovascular: Negative for chest pain, palpitations and leg swelling.  Gastrointestinal: Negative for nausea, vomiting and diarrhea.  Musculoskeletal: Positive for myalgias.  Skin: Negative for rash.  Neurological: Negative for dizziness, weakness, numbness and headaches.  Psychiatric/Behavioral: The patient is not nervous/anxious.     Objective:  BP 102/64 mmHg  Pulse 72  Temp(Src) 98 F (36.7 C) (Oral)  Resp 14  Ht 5\' 6"  (1.676 m)  Wt 146 lb (66.225 kg)  BMI 23.58 kg/m2  SpO2 98%  Physical Exam  Constitutional: She is oriented to person, place, and time. She appears well-developed and well-nourished. No distress.  HENT:  Head: Normocephalic and atraumatic.  Right Ear: External ear normal.  Left Ear: External ear normal.  Cardiovascular: Normal rate, regular rhythm and normal heart sounds.  Exam reveals no gallop and no friction rub.   No murmur heard. Pulmonary/Chest: Effort normal and breath sounds normal. No respiratory distress. She has no wheezes. She has no rales. She exhibits no tenderness.  Musculoskeletal: Normal range of motion. She exhibits no edema or tenderness.  Left leg lateral tenderness- very mild with palpation from lower thigh to right below popliteal space- slight popliteal baker's cyst, no swelling, warmth, or decrease in ROM  Neurological: She is alert and oriented to person, place, and time. No cranial nerve  deficit. She exhibits normal muscle tone. Coordination normal.  Skin: Skin is warm and dry. No rash noted. She is not diaphoretic.  Psychiatric: She has a normal mood and affect. Her behavior is normal. Judgment and thought content normal.   Assessment & Plan:   There are no diagnoses linked to this encounter. I am  having Ms. Woode maintain her acetaminophen, aspirin EC, latanoprost, polyethylene glycol powder, calcium carbonate, hydroxyurea, and calcium citrate-vitamin D.  Meds ordered this encounter  Medications  . calcium citrate-vitamin D (CITRACAL+D) 315-200 MG-UNIT tablet    Sig: Take by mouth.     Follow-up: Return in about 2 months (around 08/08/2015) for AWV subsequent- had at [place] last year.

## 2015-06-06 NOTE — Assessment & Plan Note (Signed)
Patient has not started treatment with rifampin as of yet she is trying to wean off of the Hydrea.  She will talk with her oncologist about this at the next visit

## 2015-06-06 NOTE — Patient Instructions (Addendum)
Please schedule your Annual Wellness Visit for AFTER 08/06/15.   Thank you for getting your Td- tetanus booster   Take Care

## 2015-06-06 NOTE — Assessment & Plan Note (Signed)
New onset Low risk of DVT due to location Could be nerve related or tendon related  Very mild and does not upset her ADLs or quality of life Discussed ice, stretching, and/or NSAIDs  Pt will FU if worsening or failure to improve

## 2015-06-06 NOTE — Assessment & Plan Note (Signed)
Following up with oncologist. Patient is weaning off of hydroxyurea to see with the changes would be in her platelets.

## 2015-06-08 ENCOUNTER — Inpatient Hospital Stay (HOSPITAL_BASED_OUTPATIENT_CLINIC_OR_DEPARTMENT_OTHER): Payer: Medicare Other | Admitting: Internal Medicine

## 2015-06-08 ENCOUNTER — Inpatient Hospital Stay: Payer: Medicare Other | Attending: Internal Medicine

## 2015-06-08 VITALS — BP 116/75 | HR 78 | Temp 96.8°F | Resp 18 | Ht 66.0 in | Wt 145.1 lb

## 2015-06-08 DIAGNOSIS — Z79899 Other long term (current) drug therapy: Secondary | ICD-10-CM | POA: Insufficient documentation

## 2015-06-08 DIAGNOSIS — E785 Hyperlipidemia, unspecified: Secondary | ICD-10-CM | POA: Diagnosis not present

## 2015-06-08 DIAGNOSIS — M7989 Other specified soft tissue disorders: Secondary | ICD-10-CM | POA: Insufficient documentation

## 2015-06-08 DIAGNOSIS — D473 Essential (hemorrhagic) thrombocythemia: Secondary | ICD-10-CM | POA: Insufficient documentation

## 2015-06-08 DIAGNOSIS — Z7982 Long term (current) use of aspirin: Secondary | ICD-10-CM | POA: Diagnosis not present

## 2015-06-08 DIAGNOSIS — D509 Iron deficiency anemia, unspecified: Secondary | ICD-10-CM | POA: Diagnosis not present

## 2015-06-08 LAB — CBC WITH DIFFERENTIAL/PLATELET
BASOS PCT: 1 %
Basophils Absolute: 0.1 10*3/uL (ref 0–0.1)
EOS ABS: 0.3 10*3/uL (ref 0–0.7)
EOS PCT: 4 %
HCT: 37.9 % (ref 35.0–47.0)
Hemoglobin: 12.8 g/dL (ref 12.0–16.0)
LYMPHS ABS: 1.7 10*3/uL (ref 1.0–3.6)
Lymphocytes Relative: 26 %
MCH: 31.4 pg (ref 26.0–34.0)
MCHC: 33.8 g/dL (ref 32.0–36.0)
MCV: 93.2 fL (ref 80.0–100.0)
Monocytes Absolute: 0.4 10*3/uL (ref 0.2–0.9)
Monocytes Relative: 7 %
Neutro Abs: 4.1 10*3/uL (ref 1.4–6.5)
Neutrophils Relative %: 62 %
PLATELETS: 330 10*3/uL (ref 150–440)
RBC: 4.07 MIL/uL (ref 3.80–5.20)
RDW: 12.3 % (ref 11.5–14.5)
WBC: 6.6 10*3/uL (ref 3.6–11.0)

## 2015-06-08 LAB — COMPREHENSIVE METABOLIC PANEL
ALBUMIN: 4 g/dL (ref 3.5–5.0)
ALT: 13 U/L — AB (ref 14–54)
ANION GAP: 4 — AB (ref 5–15)
AST: 21 U/L (ref 15–41)
Alkaline Phosphatase: 82 U/L (ref 38–126)
BUN: 16 mg/dL (ref 6–20)
CHLORIDE: 100 mmol/L — AB (ref 101–111)
CO2: 28 mmol/L (ref 22–32)
Calcium: 8.8 mg/dL — ABNORMAL LOW (ref 8.9–10.3)
Creatinine, Ser: 0.75 mg/dL (ref 0.44–1.00)
GFR calc non Af Amer: >60 mL/min (ref >60)
Glucose, Bld: 85 mg/dL (ref 65–99)
Potassium: 3.8 mmol/L (ref 3.5–5.1)
SODIUM: 132 mmol/L — AB (ref 135–145)
Total Bilirubin: 0.4 mg/dL (ref 0.3–1.2)
Total Protein: 7.5 g/dL (ref 6.5–8.1)

## 2015-06-08 NOTE — Progress Notes (Signed)
Pt here for ET. She states over the last 2 months the only thing she can think of when she walks or sits a lot her left leg get painful aorund calf to back of knee and then it goes away. Once the right leg did this.  She does not notice knot, redness or streaking. The discomfort always goes away.

## 2015-06-08 NOTE — Progress Notes (Signed)
[facility] OFFICE PROGRESS NOTE  Patient Care Team: Rubbie Battiest, NP as PCP - General (Nurse Practitioner)   SUMMARY OF ONCOLOGIC HISTORY:  # July 2015- ESSENTIAL THROMBOCYTOSIS [platelets- 1.5Mil; BMBx- megakaryocytosis s/o ET; bcr-abl/jak-2/MPL/CALR-NEG] [Hydrea 500 4 times a week]; DEC 2016- Hydrea reduced to 3 times a week; MARCH 2017- STOP hydrea.   # Hx of Iron def [July 2015- Ferritin-8]; colo- march 2015-polyp/no bleeding. 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 Patient is currently on Hydrea 500 mg once a week since December 2016.   Denies any tingling and numbness in her feet. Denies any burning pain. She noted to have a lump behind her left knee/ mild discomfort otherwise no pain. This is intermittent.   REVIEW OF SYSTEMS:  A complete 10 point review of system is done which is negative except mentioned above/history of present illness.   PAST MEDICAL HISTORY :  Past Medical History  Diagnosis Date  . Essential thrombocytosis ([place])   . Hyperlipidemia   . Allergy   . Asthma   . Anemia   . Glaucoma   . Diverticulosis   . Chicken pox   . Diverticulitis   . Colon polyps   . Positive QuantiFERON-TB Gold test   . Essential thrombocythemia ([place])     PAST SURGICAL HISTORY :   Past Surgical History  Procedure Laterality Date  . Trigger finger release Bilateral 2005, 2007, 2009    Thumbs and another finger    FAMILY HISTORY :   Family History  Problem Relation Age of Onset  . Hypertension Mother   . Stroke Mother   . Hypertension Father   . Heart disease Father   . Hypertension Sister   . Heart disease Sister   . Stroke Sister   . Diabetes Sister   . Breast cancer Sister 48  . Heart disease Brother   . Depression Paternal Grandmother   . Breast cancer Paternal Aunt     SOCIAL HISTORY:   Social History  Substance Use  Topics  . Smoking status: Never Smoker   . Smokeless tobacco: Never Used  . Alcohol Use: No    ALLERGIES:  is allergic to aspirin; beta adrenergic blockers; codeine sulfate; and paroxetine hcl.  MEDICATIONS:  Current Outpatient Prescriptions  Medication Sig Dispense Refill  . acetaminophen (TYLENOL) 500 MG tablet Take by mouth.    . Calcium Carb-Cholecalciferol (CALCIUM + D3) 600-200 MG-UNIT TABS Take 1 Dose by mouth daily.    Marland Kitchen latanoprost (XALATAN) 0.005 % ophthalmic solution Apply to eye.    Marland Kitchen aspirin EC 81 MG tablet Take by mouth every other day.     . hydroxyurea (HYDREA) 500 MG capsule TAKE TWO CAPSULES BY MOUTH IN THE MORNING AND ONE IN THE EVENING (Patient taking differently: 1 tablet weekly) 90 capsule 0  . polyethylene glycol powder (GLYCOLAX/MIRALAX) powder Take 17 g by mouth daily as needed.      No current facility-administered medications for this visit.    PHYSICAL EXAMINATION: ECOG PERFORMANCE STATUS: 0 - Asymptomatic  BP 116/75 mmHg  Pulse 78  Temp(Src) 96.8 F (36 C) (Tympanic)  Resp 18  Ht 5\' 6"  (1.676 m)  Wt 145 lb 1 oz (65.8 kg)  BMI 23.42 kg/m2  Filed Weights   06/08/15 1453  Weight: 145 lb 1 oz (65.8 kg)    GENERAL: Well-nourished well-developed; Alert, no distress and comfortable.  Alone. EYES: no pallor or icterus OROPHARYNX: no thrush or ulceration; good dentition  NECK: supple, no masses felt LYMPH:  no palpable lymphadenopathy in the cervical, axillary or inguinal regions LUNGS: clear to auscultation and  No wheeze or crackles HEART/CVS: regular rate & rhythm and no murmurs; No lower extremity edema; Left popliteal space- 2-3 cm cyst.  ABDOMEN:abdomen soft, non-tender and normal bowel sounds Musculoskeletal:no cyanosis of digits and no clubbing  PSYCH: alert & oriented x 3 with fluent speech NEURO: no focal motor/sensory deficits SKIN:  no rashes or significant lesions  LABORATORY DATA:  I have reviewed the data as listed    Component  Value Date/Time   NA 132* 06/08/2015 1428   NA 140 03/09/2014 1517   K 3.8 06/08/2015 1428   K 4.1 03/09/2014 1517   CL 100* 06/08/2015 1428   CL 104 03/09/2014 1517   CO2 28 06/08/2015 1428   CO2 30 03/09/2014 1517   GLUCOSE 85 06/08/2015 1428   GLUCOSE 85 03/09/2014 1517   BUN 16 06/08/2015 1428   BUN 19* 03/09/2014 1517   CREATININE 0.75 06/08/2015 1428   CREATININE 0.76 03/09/2014 1517   CALCIUM 8.8* 06/08/2015 1428   CALCIUM 8.4* 03/09/2014 1517   PROT 7.5 06/08/2015 1428   PROT 6.6 03/09/2014 1517   ALBUMIN 4.0 06/08/2015 1428   ALBUMIN 3.3* 03/09/2014 1517   AST 21 06/08/2015 1428   AST 16 03/09/2014 1517   ALT 13* 06/08/2015 1428   ALT 22 03/09/2014 1517   ALKPHOS 82 06/08/2015 1428   ALKPHOS 101 03/09/2014 1517   BILITOT 0.4 06/08/2015 1428   BILITOT 0.2 03/09/2014 1517   GFRNONAA >60 06/08/2015 1428   GFRNONAA >60 03/09/2014 1517   GFRNONAA >60 11/30/2013 1502   GFRAA >60 06/08/2015 1428   GFRAA >60 03/09/2014 1517   GFRAA >60 11/30/2013 1502    No results found for: SPEP, UPEP  Lab Results  Component Value Date   WBC 6.6 06/08/2015   NEUTROABS 4.1 06/08/2015   HGB 12.8 06/08/2015   HCT 37.9 06/08/2015   MCV 93.2 06/08/2015   PLT 330 06/08/2015      Chemistry      Component Value Date/Time   NA 132* 06/08/2015 1428   NA 140 03/09/2014 1517   K 3.8 06/08/2015 1428   K 4.1 03/09/2014 1517   CL 100* 06/08/2015 1428   CL 104 03/09/2014 1517   CO2 28 06/08/2015 1428   CO2 30 03/09/2014 1517   BUN 16 06/08/2015 1428   BUN 19* 03/09/2014 1517   CREATININE 0.75 06/08/2015 1428   CREATININE 0.76 03/09/2014 1517      Component Value Date/Time   CALCIUM 8.8* 06/08/2015 1428   CALCIUM 8.4* 03/09/2014 1517   ALKPHOS 82 06/08/2015 1428   ALKPHOS 101 03/09/2014 1517   AST 21 06/08/2015 1428   AST 16 03/09/2014 1517   ALT 13* 06/08/2015 1428   ALT 22 03/09/2014 1517   BILITOT 0.4 06/08/2015 1428   BILITOT 0.2 03/09/2014 1517       RADIOGRAPHIC  STUDIES: I have personally reviewed the radiological images as listed and agreed with the findings in the report. No results found.   ASSESSMENT & PLAN:   # Thrombocytosis- question essential thrombocytosis versus reactive. Patient is currently on Hydrea 500 mg once a week. Today her CBC is within normal limits with a normal platelet count of 330. I recommended discontinuation of Hydrea. And start rechecking CBC on a monthly basis; and see  her back in 3 months. If her platelets stay steady; then I would consider her diagnosis to be secondary/reactive. If that the case we will discharge her from the clinic at that time.  # Swelling/mild discomfort in the left knee posteriorly- clinically Baker's cyst. If gets worse recommend follow up with PCP.  # 15  minutes face-to-face with the patient discussing the above plan of care; more than 50% of time spent on prognosis/ natural history; counseling and coordination.     Cammie Sickle, MD 06/08/2015 3:03 PM

## 2015-06-22 ENCOUNTER — Telehealth: Payer: Self-pay | Admitting: Nurse Practitioner

## 2015-06-22 NOTE — Telephone Encounter (Signed)
Pt called to sch her AWV for around May 23 cannot do May 24. Call pt @ [phone]. Thank you!

## 2015-07-11 ENCOUNTER — Inpatient Hospital Stay: Payer: Medicare Other | Attending: Internal Medicine

## 2015-07-11 DIAGNOSIS — D473 Essential (hemorrhagic) thrombocythemia: Secondary | ICD-10-CM | POA: Diagnosis not present

## 2015-07-11 DIAGNOSIS — D509 Iron deficiency anemia, unspecified: Secondary | ICD-10-CM | POA: Diagnosis not present

## 2015-07-11 LAB — CBC WITH DIFFERENTIAL/PLATELET
BASOS ABS: 0.1 10*3/uL (ref 0–0.1)
BASOS PCT: 1 %
Eosinophils Absolute: 0.4 10*3/uL (ref 0–0.7)
Eosinophils Relative: 6 %
HEMATOCRIT: 38.7 % (ref 35.0–47.0)
Hemoglobin: 12.7 g/dL (ref 12.0–16.0)
Lymphocytes Relative: 30 %
Lymphs Abs: 2 10*3/uL (ref 1.0–3.6)
MCH: 30.4 pg (ref 26.0–34.0)
MCHC: 32.9 g/dL (ref 32.0–36.0)
MCV: 92.3 fL (ref 80.0–100.0)
MONO ABS: 0.3 10*3/uL (ref 0.2–0.9)
Monocytes Relative: 5 %
NEUTROS ABS: 3.9 10*3/uL (ref 1.4–6.5)
Neutrophils Relative %: 58 %
Platelets: 358 10*3/uL (ref 150–440)
RBC: 4.19 MIL/uL (ref 3.80–5.20)
RDW: 12.2 % (ref 11.5–14.5)
WBC: 6.8 10*3/uL (ref 3.6–11.0)

## 2015-07-12 ENCOUNTER — Telehealth: Payer: Self-pay | Admitting: *Deleted

## 2015-07-12 NOTE — Telephone Encounter (Signed)
-----   Message from Cammie Sickle, MD sent at 07/11/2015  4:56 PM EDT ----- Please inform patient that her platelets are normal. Continue to hold off Hydrea continue checking labs as planned. Thx

## 2015-07-12 NOTE — Telephone Encounter (Signed)
Left message on phone stating that patients platelet levels are normal and to continue holding off on hydrea,.  Please keep all scheduled lab appointments and Dr. Wyn Quaker

## 2015-08-10 ENCOUNTER — Inpatient Hospital Stay: Payer: Medicare Other | Attending: Internal Medicine

## 2015-08-10 ENCOUNTER — Telehealth: Payer: Self-pay | Admitting: *Deleted

## 2015-08-10 DIAGNOSIS — D473 Essential (hemorrhagic) thrombocythemia: Secondary | ICD-10-CM | POA: Insufficient documentation

## 2015-08-10 DIAGNOSIS — D509 Iron deficiency anemia, unspecified: Secondary | ICD-10-CM | POA: Diagnosis not present

## 2015-08-10 LAB — CBC WITH DIFFERENTIAL/PLATELET
Basophils Absolute: 0.1 10*3/uL (ref 0–0.1)
Basophils Relative: 1 %
EOS ABS: 0.3 10*3/uL (ref 0–0.7)
EOS PCT: 5 %
HCT: 37.6 % (ref 35.0–47.0)
HEMOGLOBIN: 12.6 g/dL (ref 12.0–16.0)
LYMPHS ABS: 1.5 10*3/uL (ref 1.0–3.6)
LYMPHS PCT: 24 %
MCH: 29.8 pg (ref 26.0–34.0)
MCHC: 33.5 g/dL (ref 32.0–36.0)
MCV: 89.1 fL (ref 80.0–100.0)
MONOS PCT: 4 %
Monocytes Absolute: 0.2 10*3/uL (ref 0.2–0.9)
Neutro Abs: 4.4 10*3/uL (ref 1.4–6.5)
Neutrophils Relative %: 66 %
PLATELETS: 366 10*3/uL (ref 150–440)
RBC: 4.21 MIL/uL (ref 3.80–5.20)
RDW: 12.5 % (ref 11.5–14.5)
WBC: 6.6 10*3/uL (ref 3.6–11.0)

## 2015-08-10 NOTE — Telephone Encounter (Signed)
-----   Message from Cammie Sickle, MD sent at 08/10/2015  3:50 PM EDT ----- Please inform patient that platelets are normal; follow-up is planned.

## 2015-08-10 NOTE — Telephone Encounter (Signed)
rn called patient. Left vm that labs were normal. We will follow-up with the labs again in June as scheduled.

## 2015-08-12 ENCOUNTER — Ambulatory Visit: Payer: PRIVATE HEALTH INSURANCE

## 2015-09-07 ENCOUNTER — Inpatient Hospital Stay (HOSPITAL_BASED_OUTPATIENT_CLINIC_OR_DEPARTMENT_OTHER): Payer: Medicare Other | Admitting: Oncology

## 2015-09-07 ENCOUNTER — Inpatient Hospital Stay: Payer: Medicare Other | Attending: Oncology

## 2015-09-07 VITALS — BP 99/68 | HR 78 | Wt 144.0 lb

## 2015-09-07 DIAGNOSIS — E785 Hyperlipidemia, unspecified: Secondary | ICD-10-CM

## 2015-09-07 DIAGNOSIS — Z79899 Other long term (current) drug therapy: Secondary | ICD-10-CM | POA: Insufficient documentation

## 2015-09-07 DIAGNOSIS — D473 Essential (hemorrhagic) thrombocythemia: Secondary | ICD-10-CM | POA: Diagnosis present

## 2015-09-07 DIAGNOSIS — Z86010 Personal history of colonic polyps: Secondary | ICD-10-CM | POA: Insufficient documentation

## 2015-09-07 DIAGNOSIS — D509 Iron deficiency anemia, unspecified: Secondary | ICD-10-CM

## 2015-09-07 LAB — CBC WITH DIFFERENTIAL/PLATELET
BASOS PCT: 1 %
Basophils Absolute: 0.1 10*3/uL (ref 0–0.1)
EOS ABS: 0.2 10*3/uL (ref 0–0.7)
Eosinophils Relative: 3 %
HEMATOCRIT: 36.3 % (ref 35.0–47.0)
HEMOGLOBIN: 12.2 g/dL (ref 12.0–16.0)
LYMPHS ABS: 2.2 10*3/uL (ref 1.0–3.6)
Lymphocytes Relative: 27 %
MCH: 29.7 pg (ref 26.0–34.0)
MCHC: 33.6 g/dL (ref 32.0–36.0)
MCV: 88.3 fL (ref 80.0–100.0)
MONO ABS: 0.4 10*3/uL (ref 0.2–0.9)
MONOS PCT: 6 %
NEUTROS PCT: 63 %
Neutro Abs: 5 10*3/uL (ref 1.4–6.5)
Platelets: 378 10*3/uL (ref 150–440)
RBC: 4.12 MIL/uL (ref 3.80–5.20)
RDW: 13 % (ref 11.5–14.5)
WBC: 8 10*3/uL (ref 3.6–11.0)

## 2015-09-07 NOTE — Progress Notes (Signed)
[facility]  [phone]  [phone]     Tina Cook DOB: [birth year]  MR#: [id number]  [id number]  Patient Care Team: Rubbie Battiest, NP as PCP - General (Nurse Practitioner)  CHIEF COMPLAINT:  Chief Complaint  Patient presents with  . Follow-up   SUMMARY OF ONCOLOGIC HISTORY:  # July 2015- ESSENTIAL THROMBOCYTOSIS [platelets- 1.5Mil; BMBx- megakaryocytosis s/o ET; bcr-abl/jak-2/MPL/CALR-NEG] [Hydrea 500 4 times a week]; DEC 2016- Hydrea reduced to 3 times a week; MARCH 2017- STOP hydrea.   # Hx of Iron def [July 2015- Ferritin-8]; colo- march 2015-polyp/no bleeding. RMSF [summer 2015]  INTERVAL HISTORY: Patient returns to clinic today for 3 month follow up to review lab work. She feels well today and is asymptomatic. She has no complaints today. Denies any tingling and numbness in her feet. Denies any burning pain.   REVIEW OF SYSTEMS:   Review of Systems  Constitutional: Negative.   HENT: Negative.   Eyes: Negative.   Respiratory: Negative.   Cardiovascular: Negative.   Gastrointestinal: Negative.   Genitourinary: Negative.   Musculoskeletal: Negative.   Skin: Negative.   Neurological: Negative.   Endo/Heme/Allergies: Negative.   Psychiatric/Behavioral: Negative.     As per HPI. Otherwise, a complete review of systems is negatve.  ONCOLOGY HISTORY:  No history exists.    PAST MEDICAL HISTORY: Past Medical History  Diagnosis Date  . Essential thrombocytosis ([place])   . Hyperlipidemia   . Allergy   . Asthma   . Anemia   . Glaucoma   . Diverticulosis   . Chicken pox   . Diverticulitis   . Colon polyps   . Positive QuantiFERON-TB Gold test   . Essential thrombocythemia ([place])     PAST SURGICAL HISTORY: Past Surgical History  Procedure Laterality Date  . Trigger finger release Bilateral 2005, 2007, 2009    Thumbs and another finger    FAMILY HISTORY Family History  Problem Relation Age of Onset  . Hypertension  Mother   . Stroke Mother   . Hypertension Father   . Heart disease Father   . Hypertension Sister   . Heart disease Sister   . Stroke Sister   . Diabetes Sister   . Breast cancer Sister 57  . Heart disease Brother   . Depression Paternal Grandmother   . Breast cancer Paternal Aunt     GYNECOLOGIC HISTORY:  No LMP recorded. Patient is postmenopausal.     ADVANCED DIRECTIVES:    HEALTH MAINTENANCE: Social History  Substance Use Topics  . Smoking status: Never Smoker   . Smokeless tobacco: Never Used  . Alcohol Use: No    Allergies  Allergen Reactions  . Aspirin     Other reaction(s): Unknown  . Beta Adrenergic Blockers Other (See Comments)  . Codeine Sulfate     Other reaction(s): Unknown  . Paroxetine Hcl Other (See Comments)    Current Outpatient Prescriptions  Medication Sig Dispense Refill  . acetaminophen (TYLENOL) 500 MG tablet Take by mouth.    Marland Kitchen aspirin EC 81 MG tablet Take by mouth every other day.     . Calcium Carb-Cholecalciferol (CALCIUM + D3) 600-200 MG-UNIT TABS Take 1 Dose by mouth daily.    Marland Kitchen latanoprost (XALATAN) 0.005 % ophthalmic solution Apply to eye.    . polyethylene glycol powder (GLYCOLAX/MIRALAX) powder Take 17 g by mouth daily as needed.      No current facility-administered medications for this visit.    OBJECTIVE: BP 99/68 mmHg  Pulse 78  Wt 144 lb (65.318 kg)   Body mass index is 23.25 kg/(m^2).    ECOG FS:0 - Asymptomatic  General: Well-developed, well-nourished, no acute distress. Eyes: Pink conjunctiva, anicteric sclera. HEENT: Normocephalic, moist mucous membranes, clear oropharnyx. Lungs: Clear to auscultation bilaterally. Heart: Regular rate and rhythm. No rubs, murmurs, or gallops. Musculoskeletal: No edema, cyanosis, or clubbing. Neuro: Alert, answering all questions appropriately. Skin: No rashes or petechiae noted. Psych: Normal affect.   LAB RESULTS:  Appointment on 09/07/2015  Component Date Value Ref Range  Status  . WBC 09/07/2015 8.0  3.6 - 11.0 K/uL Final  . RBC 09/07/2015 4.12  3.80 - 5.20 MIL/uL Final  . Hemoglobin 09/07/2015 12.2  12.0 - 16.0 g/dL Final  . HCT 09/07/2015 36.3  35.0 - 47.0 % Final  . MCV 09/07/2015 88.3  80.0 - 100.0 fL Final  . MCH 09/07/2015 29.7  26.0 - 34.0 pg Final  . MCHC 09/07/2015 33.6  32.0 - 36.0 g/dL Final  . RDW 09/07/2015 13.0  11.5 - 14.5 % Final  . Platelets 09/07/2015 378  150 - 440 K/uL Final  . Neutrophils Relative % 09/07/2015 63   Final  . Neutro Abs 09/07/2015 5.0  1.4 - 6.5 K/uL Final  . Lymphocytes Relative 09/07/2015 27   Final  . Lymphs Abs 09/07/2015 2.2  1.0 - 3.6 K/uL Final  . Monocytes Relative 09/07/2015 6   Final  . Monocytes Absolute 09/07/2015 0.4  0.2 - 0.9 K/uL Final  . Eosinophils Relative 09/07/2015 3   Final  . Eosinophils Absolute 09/07/2015 0.2  0 - 0.7 K/uL Final  . Basophils Relative 09/07/2015 1   Final  . Basophils Absolute 09/07/2015 0.1  0 - 0.1 K/uL Final    STUDIES: No results found.  ASSESSMENT: Thrombocytosis  PLAN:  1. Thrombocytosis-Patient discontinued hydrea in March 2017. Today her CBC is within normal limits with a normal platelet count of 378. Patient requests to continue rechecking CBC on a monthly basis; and see her back in 3 months. If her platelets stay steady  we will discharge her from the clinic at that time.  Patient expressed understanding and was in agreement with this plan. She also understands that She can call clinic at any time with any questions, concerns, or complaints.   Dr. Rogue Bussing was available for consultation and review of plan of care for this patient.  [name], NP   09/07/2015 3:31 PM

## 2015-09-07 NOTE — Progress Notes (Deleted)
Patient denies pain or discomfort, ambulates without assistance, brought to exam room 15.  Vitals documented, medication record updated, information provided by patient.  Dr. Ree Kida

## 2015-09-07 NOTE — Progress Notes (Signed)
Patient ambulates without assistance, brought to exam room 18.  Patient c/o chest discomfort  when coughing, states he's coughing up thick sputum.  Vitals documented, medication record updated information provided by patient.  Dr. Ree Kida.

## 2015-10-06 ENCOUNTER — Inpatient Hospital Stay: Payer: Medicare Other | Attending: Internal Medicine

## 2015-10-06 DIAGNOSIS — D509 Iron deficiency anemia, unspecified: Secondary | ICD-10-CM | POA: Diagnosis not present

## 2015-10-06 DIAGNOSIS — D473 Essential (hemorrhagic) thrombocythemia: Secondary | ICD-10-CM | POA: Diagnosis not present

## 2015-10-06 LAB — CBC WITH DIFFERENTIAL/PLATELET
BASOS PCT: 1 %
Basophils Absolute: 0.1 10*3/uL (ref 0–0.1)
EOS ABS: 0.3 10*3/uL (ref 0–0.7)
Eosinophils Relative: 5 %
HEMATOCRIT: 35.7 % (ref 35.0–47.0)
HEMOGLOBIN: 12.1 g/dL (ref 12.0–16.0)
LYMPHS ABS: 1.6 10*3/uL (ref 1.0–3.6)
Lymphocytes Relative: 26 %
MCH: 29.7 pg (ref 26.0–34.0)
MCHC: 34 g/dL (ref 32.0–36.0)
MCV: 87.4 fL (ref 80.0–100.0)
MONOS PCT: 6 %
Monocytes Absolute: 0.3 10*3/uL (ref 0.2–0.9)
NEUTROS ABS: 3.8 10*3/uL (ref 1.4–6.5)
NEUTROS PCT: 62 %
Platelets: 365 10*3/uL (ref 150–440)
RBC: 4.08 MIL/uL (ref 3.80–5.20)
RDW: 13.1 % (ref 11.5–14.5)
WBC: 6.2 10*3/uL (ref 3.6–11.0)

## 2015-10-07 ENCOUNTER — Inpatient Hospital Stay: Payer: Medicare Other

## 2015-11-07 ENCOUNTER — Inpatient Hospital Stay: Payer: Medicare Other | Attending: Internal Medicine

## 2015-11-07 DIAGNOSIS — D509 Iron deficiency anemia, unspecified: Secondary | ICD-10-CM | POA: Insufficient documentation

## 2015-11-07 DIAGNOSIS — D473 Essential (hemorrhagic) thrombocythemia: Secondary | ICD-10-CM | POA: Diagnosis not present

## 2015-11-07 LAB — CBC WITH DIFFERENTIAL/PLATELET
BASOS ABS: 0.1 10*3/uL (ref 0–0.1)
BASOS PCT: 1 %
EOS PCT: 4 %
Eosinophils Absolute: 0.3 10*3/uL (ref 0–0.7)
HCT: 36 % (ref 35.0–47.0)
Hemoglobin: 12.1 g/dL (ref 12.0–16.0)
Lymphocytes Relative: 25 %
Lymphs Abs: 1.9 10*3/uL (ref 1.0–3.6)
MCH: 29.2 pg (ref 26.0–34.0)
MCHC: 33.7 g/dL (ref 32.0–36.0)
MCV: 86.7 fL (ref 80.0–100.0)
MONO ABS: 0.4 10*3/uL (ref 0.2–0.9)
MONOS PCT: 5 %
Neutro Abs: 4.8 10*3/uL (ref 1.4–6.5)
Neutrophils Relative %: 65 %
PLATELETS: 412 10*3/uL (ref 150–440)
RBC: 4.16 MIL/uL (ref 3.80–5.20)
RDW: 13.1 % (ref 11.5–14.5)
WBC: 7.6 10*3/uL (ref 3.6–11.0)

## 2015-11-08 ENCOUNTER — Telehealth: Payer: Self-pay

## 2015-11-08 NOTE — Telephone Encounter (Signed)
Called and left VM for pt to please call me back about labs.

## 2015-11-09 NOTE — Telephone Encounter (Signed)
Patient informed to stay off her Hydrea and FU in 1 month. She stated she already has an appt 9/21 and she is happy that she dose not need to take hydrea

## 2015-12-01 ENCOUNTER — Other Ambulatory Visit: Payer: Self-pay | Admitting: Obstetrics and Gynecology

## 2015-12-01 DIAGNOSIS — Z1231 Encounter for screening mammogram for malignant neoplasm of breast: Secondary | ICD-10-CM

## 2015-12-07 ENCOUNTER — Other Ambulatory Visit: Payer: Self-pay

## 2015-12-07 DIAGNOSIS — D473 Essential (hemorrhagic) thrombocythemia: Secondary | ICD-10-CM

## 2015-12-08 ENCOUNTER — Inpatient Hospital Stay: Payer: Medicare Other | Attending: Internal Medicine | Admitting: Internal Medicine

## 2015-12-08 ENCOUNTER — Inpatient Hospital Stay: Payer: Medicare Other

## 2015-12-08 DIAGNOSIS — D509 Iron deficiency anemia, unspecified: Secondary | ICD-10-CM | POA: Diagnosis not present

## 2015-12-08 DIAGNOSIS — Z803 Family history of malignant neoplasm of breast: Secondary | ICD-10-CM

## 2015-12-08 DIAGNOSIS — Z79899 Other long term (current) drug therapy: Secondary | ICD-10-CM

## 2015-12-08 DIAGNOSIS — Z7982 Long term (current) use of aspirin: Secondary | ICD-10-CM

## 2015-12-08 DIAGNOSIS — R7989 Other specified abnormal findings of blood chemistry: Secondary | ICD-10-CM | POA: Insufficient documentation

## 2015-12-08 DIAGNOSIS — D473 Essential (hemorrhagic) thrombocythemia: Secondary | ICD-10-CM

## 2015-12-08 DIAGNOSIS — J45909 Unspecified asthma, uncomplicated: Secondary | ICD-10-CM | POA: Diagnosis not present

## 2015-12-08 DIAGNOSIS — H409 Unspecified glaucoma: Secondary | ICD-10-CM | POA: Diagnosis not present

## 2015-12-08 DIAGNOSIS — Z86010 Personal history of colonic polyps: Secondary | ICD-10-CM | POA: Diagnosis not present

## 2015-12-08 DIAGNOSIS — D75839 Thrombocytosis, unspecified: Secondary | ICD-10-CM

## 2015-12-08 DIAGNOSIS — E785 Hyperlipidemia, unspecified: Secondary | ICD-10-CM

## 2015-12-08 DIAGNOSIS — D75838 Other thrombocytosis: Secondary | ICD-10-CM | POA: Insufficient documentation

## 2015-12-08 LAB — CBC WITH DIFFERENTIAL/PLATELET
BASOS PCT: 1 %
Basophils Absolute: 0.1 10*3/uL (ref 0–0.1)
Eosinophils Absolute: 0.3 10*3/uL (ref 0–0.7)
Eosinophils Relative: 5 %
HEMATOCRIT: 35.4 % (ref 35.0–47.0)
HEMOGLOBIN: 11.9 g/dL — AB (ref 12.0–16.0)
LYMPHS ABS: 1.9 10*3/uL (ref 1.0–3.6)
LYMPHS PCT: 26 %
MCH: 29.1 pg (ref 26.0–34.0)
MCHC: 33.6 g/dL (ref 32.0–36.0)
MCV: 86.7 fL (ref 80.0–100.0)
MONO ABS: 0.4 10*3/uL (ref 0.2–0.9)
Monocytes Relative: 5 %
NEUTROS ABS: 4.6 10*3/uL (ref 1.4–6.5)
Neutrophils Relative %: 63 %
Platelets: 405 10*3/uL (ref 150–440)
RBC: 4.08 MIL/uL (ref 3.80–5.20)
RDW: 13.4 % (ref 11.5–14.5)
WBC: 7.3 10*3/uL (ref 3.6–11.0)

## 2015-12-08 NOTE — Progress Notes (Signed)
[place] OFFICE PROGRESS NOTE  Patient Care Team: Rubbie Battiest, NP as PCP - General (Nurse Practitioner)   SUMMARY OF ONCOLOGIC HISTORY:  # July 2015-SEVER THOMBOCYTOSIS [platelets- 1.5Mil; BMBx- megakaryocytosis s/o ET; bcr-abl/jak-2/MPL/CALR-NEG] [Hydrea 500 4 times a week]; DEC 2016- Hydrea reduced to 3 times a week; MARCH 2017- STOP hydrea.   # Hx of Iron def [July 2015- Ferritin-8]; colo- march 2015-polyp/no bleeding. 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 Patient has been off Hydrea since March 2017   Denies any tingling and numbness in her feet. Denies any burning pain.  No weight loss no loss of appetite. No nausea no vomiting no blood clots.   REVIEW OF SYSTEMS:  A complete 10 point review of system is done which is negative except mentioned above/history of present illness.   PAST MEDICAL HISTORY :  Past Medical History:  Diagnosis Date  . Allergy   . Anemia   . Asthma   . Chicken pox   . Colon polyps   . Diverticulitis   . Diverticulosis   . Essential thrombocythemia ([place])   . Essential thrombocytosis ([employer])   . Glaucoma   . Hyperlipidemia   . Positive QuantiFERON-TB Gold test     PAST SURGICAL HISTORY :   Past Surgical History:  Procedure Laterality Date  . TRIGGER FINGER RELEASE Bilateral 2005, 2007, 2009   Thumbs and another finger    FAMILY HISTORY :   Family History  Problem Relation Age of Onset  . Hypertension Mother   . Stroke Mother   . Hypertension Father   . Heart disease Father   . Hypertension Sister   . Heart disease Sister   . Stroke Sister   . Diabetes Sister   . Breast cancer Sister 70  . Heart disease Brother   . Depression Paternal Grandmother   . Breast cancer Paternal Aunt     SOCIAL HISTORY:   Social History  Substance Use Topics  . Smoking status: Never Smoker  . Smokeless tobacco:  Never Used  . Alcohol use No    ALLERGIES:  is allergic to aspirin; beta adrenergic blockers; codeine sulfate; and paroxetine hcl.  MEDICATIONS:  Current Outpatient Prescriptions  Medication Sig Dispense Refill  . aspirin EC 81 MG tablet Take by mouth every other day.     . Calcium Carb-Cholecalciferol (CALCIUM + D3) 600-200 MG-UNIT TABS Take 1 Dose by mouth daily.    Marland Kitchen latanoprost (XALATAN) 0.005 % ophthalmic solution Apply to eye.    . polyethylene glycol powder (GLYCOLAX/MIRALAX) powder Take 17 g by mouth daily as needed.     Marland Kitchen acetaminophen (TYLENOL) 500 MG tablet Take by mouth.     No current facility-administered medications for this visit.     PHYSICAL EXAMINATION: ECOG PERFORMANCE STATUS: 0 - Asymptomatic  BP 106/69 (BP Location: Right Arm, Patient Position: Sitting)   Pulse 80   Temp 97.8 F (36.6 C) (Tympanic)   Resp 18   Ht 5\' 6"  (1.676 m)   Wt 144 lb (65.3 kg)   BMI 23.24 kg/m   Filed Weights   12/08/15 1449  Weight: 144 lb (65.3 kg)    GENERAL: Well-nourished well-developed; Alert, no distress and comfortable.   Alone. EYES: no pallor or icterus OROPHARYNX: no thrush or ulceration; good dentition  NECK: supple, no masses felt LYMPH:  no palpable lymphadenopathy in the cervical, axillary or inguinal  regions LUNGS: clear to auscultation and  No wheeze or crackles HEART/CVS: regular rate & rhythm and no murmurs; No lower extremity edema; ABDOMEN:abdomen soft, non-tender and normal bowel sounds Musculoskeletal:no cyanosis of digits and no clubbing  PSYCH: alert & oriented x 3 with fluent speech NEURO: no focal motor/sensory deficits SKIN:  no rashes or significant lesions  LABORATORY DATA:  I have reviewed the data as listed    Component Value Date/Time   NA 132 (L) 06/08/2015 1428   NA 140 03/09/2014 1517   K 3.8 06/08/2015 1428   K 4.1 03/09/2014 1517   CL 100 (L) 06/08/2015 1428   CL 104 03/09/2014 1517   CO2 28 06/08/2015 1428   CO2 30  03/09/2014 1517   GLUCOSE 85 06/08/2015 1428   GLUCOSE 85 03/09/2014 1517   BUN 16 06/08/2015 1428   BUN 19 (H) 03/09/2014 1517   CREATININE 0.75 06/08/2015 1428   CREATININE 0.76 03/09/2014 1517   CALCIUM 8.8 (L) 06/08/2015 1428   CALCIUM 8.4 (L) 03/09/2014 1517   PROT 7.5 06/08/2015 1428   PROT 6.6 03/09/2014 1517   ALBUMIN 4.0 06/08/2015 1428   ALBUMIN 3.3 (L) 03/09/2014 1517   AST 21 06/08/2015 1428   AST 16 03/09/2014 1517   ALT 13 (L) 06/08/2015 1428   ALT 22 03/09/2014 1517   ALKPHOS 82 06/08/2015 1428   ALKPHOS 101 03/09/2014 1517   BILITOT 0.4 06/08/2015 1428   BILITOT 0.2 03/09/2014 1517   GFRNONAA >60 06/08/2015 1428   GFRNONAA >60 03/09/2014 1517   GFRNONAA >60 11/30/2013 1502   GFRAA >60 06/08/2015 1428   GFRAA >60 03/09/2014 1517   GFRAA >60 11/30/2013 1502    No results found for: SPEP, UPEP  Lab Results  Component Value Date   WBC 7.3 12/08/2015   NEUTROABS 4.6 12/08/2015   HGB 11.9 (L) 12/08/2015   HCT 35.4 12/08/2015   MCV 86.7 12/08/2015   PLT 405 12/08/2015      Chemistry      Component Value Date/Time   NA 132 (L) 06/08/2015 1428   NA 140 03/09/2014 1517   K 3.8 06/08/2015 1428   K 4.1 03/09/2014 1517   CL 100 (L) 06/08/2015 1428   CL 104 03/09/2014 1517   CO2 28 06/08/2015 1428   CO2 30 03/09/2014 1517   BUN 16 06/08/2015 1428   BUN 19 (H) 03/09/2014 1517   CREATININE 0.75 06/08/2015 1428   CREATININE 0.76 03/09/2014 1517      Component Value Date/Time   CALCIUM 8.8 (L) 06/08/2015 1428   CALCIUM 8.4 (L) 03/09/2014 1517   ALKPHOS 82 06/08/2015 1428   ALKPHOS 101 03/09/2014 1517   AST 21 06/08/2015 1428   AST 16 03/09/2014 1517   ALT 13 (L) 06/08/2015 1428   ALT 22 03/09/2014 1517   BILITOT 0.4 06/08/2015 1428   BILITOT 0.2 03/09/2014 1517       RADIOGRAPHIC STUDIES: I have personally reviewed the radiological images as listed and agreed with the findings in the report. No results found.   ASSESSMENT & PLAN:   Thrombocytosis ([place])  # Thrombocytosis- question essential thrombocytosis versus reactive. Patient has been off Hydrea for the last 6 months since March 2017; platelets of 450/normal. I suspect this is more reactive rather essential thrombocytosis  # ? 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 If labs continue to be stable-  discharge the clinic follow up with PCP.  Cammie Sickle, MD 12/09/2015 8:05 AM

## 2015-12-08 NOTE — Assessment & Plan Note (Addendum)
#   Thrombocytosis- question essential thrombocytosis versus reactive. Patient has been off Hydrea for the last 6 months since March 2017; platelets of 405/normal. I suspect this is more reactive rather essential thrombocytosis  # ? 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 If labs continue to be stable-  discharge the clinic follow up with PCP.

## 2016-03-05 ENCOUNTER — Ambulatory Visit
Admission: RE | Admit: 2016-03-05 | Discharge: 2016-03-05 | Disposition: A | Discharge status: Home / Self Care | Payer: Medicare Other | Source: Ambulatory Visit | Attending: Obstetrics and Gynecology | Admitting: Obstetrics and Gynecology

## 2016-03-05 DIAGNOSIS — Z1231 Encounter for screening mammogram for malignant neoplasm of breast: Secondary | ICD-10-CM | POA: Diagnosis not present

## 2016-06-06 ENCOUNTER — Inpatient Hospital Stay: Payer: Medicare Other

## 2016-06-06 ENCOUNTER — Inpatient Hospital Stay: Payer: Medicare Other | Attending: Internal Medicine | Admitting: Internal Medicine

## 2016-06-06 VITALS — BP 109/74 | HR 85 | Temp 97.8°F | Resp 18 | Wt 147.0 lb

## 2016-06-06 DIAGNOSIS — D75839 Thrombocytosis, unspecified: Secondary | ICD-10-CM

## 2016-06-06 DIAGNOSIS — D473 Essential (hemorrhagic) thrombocythemia: Secondary | ICD-10-CM | POA: Diagnosis not present

## 2016-06-06 DIAGNOSIS — E785 Hyperlipidemia, unspecified: Secondary | ICD-10-CM | POA: Insufficient documentation

## 2016-06-06 DIAGNOSIS — Z79899 Other long term (current) drug therapy: Secondary | ICD-10-CM | POA: Diagnosis not present

## 2016-06-06 DIAGNOSIS — Z803 Family history of malignant neoplasm of breast: Secondary | ICD-10-CM | POA: Diagnosis not present

## 2016-06-06 DIAGNOSIS — Z7982 Long term (current) use of aspirin: Secondary | ICD-10-CM | POA: Insufficient documentation

## 2016-06-06 LAB — CBC WITH DIFFERENTIAL/PLATELET
BASOS ABS: 0.1 10*3/uL (ref 0–0.1)
Basophils Relative: 1 %
EOS ABS: 0.4 10*3/uL (ref 0–0.7)
EOS PCT: 5 %
HCT: 36.4 % (ref 35.0–47.0)
Hemoglobin: 12.2 g/dL (ref 12.0–16.0)
LYMPHS PCT: 28 %
Lymphs Abs: 1.9 10*3/uL (ref 1.0–3.6)
MCH: 29 pg (ref 26.0–34.0)
MCHC: 33.6 g/dL (ref 32.0–36.0)
MCV: 86.3 fL (ref 80.0–100.0)
MONO ABS: 0.3 10*3/uL (ref 0.2–0.9)
Monocytes Relative: 4 %
Neutro Abs: 4.3 10*3/uL (ref 1.4–6.5)
Neutrophils Relative %: 62 %
PLATELETS: 460 10*3/uL — AB (ref 150–440)
RBC: 4.22 MIL/uL (ref 3.80–5.20)
RDW: 13.7 % (ref 11.5–14.5)
WBC: 7 10*3/uL (ref 3.6–11.0)

## 2016-06-06 NOTE — Progress Notes (Signed)
Patient here today for follow up.  Patient states no new concerns today  

## 2016-06-06 NOTE — Progress Notes (Signed)
[place] OFFICE PROGRESS NOTE  Patient Care Team: Madelyn Brunner, MD as PCP - General (Internal Medicine)   SUMMARY OF ONCOLOGIC HISTORY: Oncology History   # July 2015- ESSENTIAL THROMBOCYTOSIS [platelets- 1.5Mil; BMBx- megakaryocytosis s/o ET; bcr-abl/jak-2/MPL/CALR-NEG] [Hydrea 500 4 times a week]; DEC 2016- Hydrea reduced to 3 times a week; MARCH 2017- STOP hydrea.   # Hx of Iron def [July 2015- Ferritin-8]; colo- march 2015-polyp/no bleeding. 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 Patient has been off Hydrea since March 2017.   Patient takes aspirin every other day. Denies any tingling and numbness in her feet. Denies any burning pain.  No weight loss no loss of appetite. No blood clots.    REVIEW OF SYSTEMS:  A complete 10 point review of system is done which is negative except mentioned above/history of present illness.   PAST MEDICAL HISTORY :  Past Medical History:  Diagnosis Date  . Allergy   . Anemia   . Asthma   . Chicken pox   . Colon polyps   . Diverticulitis   . Diverticulosis   . Essential thrombocythemia ([place])   . Essential thrombocytosis ([place])   . Glaucoma   . Hyperlipidemia   . Positive QuantiFERON-TB Gold test     PAST SURGICAL HISTORY :   Past Surgical History:  Procedure Laterality Date  . TRIGGER FINGER RELEASE Bilateral 2005, 2007, 2009   Thumbs and another finger    FAMILY HISTORY :   Family History  Problem Relation Age of Onset  . Hypertension Mother   . Stroke Mother   . Hypertension Father   . Heart disease Father   . Depression Paternal Grandmother   . Hypertension Sister   . Heart disease Sister   . Stroke Sister   . Diabetes Sister   . Breast cancer Sister 59  . Heart disease Brother   . Breast cancer Paternal Aunt     SOCIAL HISTORY:    Social History  Substance Use Topics  . Smoking status: Never Smoker  . Smokeless tobacco: Never Used  . Alcohol use No    ALLERGIES:  is allergic to aspirin; beta adrenergic blockers; codeine sulfate; and paroxetine hcl.  MEDICATIONS:  Current Outpatient Prescriptions  Medication Sig Dispense Refill  . acetaminophen (TYLENOL) 500 MG tablet Take by mouth.    Marland Kitchen aspirin EC 81 MG tablet Take by mouth every other day.     . Calcium Carb-Cholecalciferol (CALCIUM + D3) 600-200 MG-UNIT TABS Take 1 Dose by mouth daily.    Marland Kitchen latanoprost (XALATAN) 0.005 % ophthalmic solution Apply to eye.    . polyethylene glycol powder (GLYCOLAX/MIRALAX) powder Take 17 g by mouth daily as needed.     . Probiotic Product ([place]) Take by mouth.     No current facility-administered medications for this visit.     PHYSICAL EXAMINATION: ECOG PERFORMANCE STATUS: 0 - Asymptomatic  BP 109/74 (BP Location: Left Arm, Patient Position: Sitting)   Pulse 85   Temp 97.8 F (36.6 C) (Tympanic)   Resp 18   Wt 147 lb (66.7 kg)   BMI 23.73 kg/m   Filed Weights   06/06/16 1513  Weight: 147 lb (66.7 kg)    GENERAL: Well-nourished well-developed; Alert, no distress and comfortable.   Alone. EYES: no pallor or icterus  OROPHARYNX: no thrush or ulceration; good dentition  NECK: supple, no masses felt LYMPH:  no palpable lymphadenopathy in the cervical, axillary or inguinal regions LUNGS: clear to auscultation and  No wheeze or crackles HEART/CVS: regular rate & rhythm and no murmurs; No lower extremity edema; ABDOMEN:abdomen soft, non-tender and normal bowel sounds Musculoskeletal:no cyanosis of digits and no clubbing  PSYCH: alert & oriented x 3 with fluent speech NEURO: no focal motor/sensory deficits SKIN:  no rashes or significant lesions  LABORATORY DATA:  I have reviewed the data as listed    Component Value Date/Time   NA 132 (L) 06/08/2015 1428   NA 140 03/09/2014 1517   K 3.8  06/08/2015 1428   K 4.1 03/09/2014 1517   CL 100 (L) 06/08/2015 1428   CL 104 03/09/2014 1517   CO2 28 06/08/2015 1428   CO2 30 03/09/2014 1517   GLUCOSE 85 06/08/2015 1428   GLUCOSE 85 03/09/2014 1517   BUN 16 06/08/2015 1428   BUN 19 (H) 03/09/2014 1517   CREATININE 0.75 06/08/2015 1428   CREATININE 0.76 03/09/2014 1517   CALCIUM 8.8 (L) 06/08/2015 1428   CALCIUM 8.4 (L) 03/09/2014 1517   PROT 7.5 06/08/2015 1428   PROT 6.6 03/09/2014 1517   ALBUMIN 4.0 06/08/2015 1428   ALBUMIN 3.3 (L) 03/09/2014 1517   AST 21 06/08/2015 1428   AST 16 03/09/2014 1517   ALT 13 (L) 06/08/2015 1428   ALT 22 03/09/2014 1517   ALKPHOS 82 06/08/2015 1428   ALKPHOS 101 03/09/2014 1517   BILITOT 0.4 06/08/2015 1428   BILITOT 0.2 03/09/2014 1517   GFRNONAA >60 06/08/2015 1428   GFRNONAA >60 03/09/2014 1517   GFRNONAA >60 11/30/2013 1502   GFRAA >60 06/08/2015 1428   GFRAA >60 03/09/2014 1517   GFRAA >60 11/30/2013 1502    No results found for: SPEP, UPEP  Lab Results  Component Value Date   WBC 7.0 06/06/2016   NEUTROABS 4.3 06/06/2016   HGB 12.2 06/06/2016   HCT 36.4 06/06/2016   MCV 86.3 06/06/2016   PLT 460 (H) 06/06/2016      Chemistry      Component Value Date/Time   NA 132 (L) 06/08/2015 1428   NA 140 03/09/2014 1517   K 3.8 06/08/2015 1428   K 4.1 03/09/2014 1517   CL 100 (L) 06/08/2015 1428   CL 104 03/09/2014 1517   CO2 28 06/08/2015 1428   CO2 30 03/09/2014 1517   BUN 16 06/08/2015 1428   BUN 19 (H) 03/09/2014 1517   CREATININE 0.75 06/08/2015 1428   CREATININE 0.76 03/09/2014 1517      Component Value Date/Time   CALCIUM 8.8 (L) 06/08/2015 1428   CALCIUM 8.4 (L) 03/09/2014 1517   ALKPHOS 82 06/08/2015 1428   ALKPHOS 101 03/09/2014 1517   AST 21 06/08/2015 1428   AST 16 03/09/2014 1517   ALT 13 (L) 06/08/2015 1428   ALT 22 03/09/2014 1517   BILITOT 0.4 06/08/2015 1428   BILITOT 0.2 03/09/2014 1517       RADIOGRAPHIC STUDIES: I have personally reviewed  the radiological images as listed and agreed with the findings in the report. No results found.   ASSESSMENT & PLAN:  Essential thrombocytosis # Thrombocytosis- question essential thrombocytosis versus reactive. Patient has been off Hydrea for the last 12 months since March 2017; platelets of 460 [slightly above normal]. I still suspect this is more reactive rather essential thrombocytosis. Rule out iron deficiency.  #  Plan  follow up in 6 months /cbc. Iron studies/cbc in 3 months.        Cammie Sickle, MD 06/06/2016 3:44 PM

## 2016-06-06 NOTE — Assessment & Plan Note (Addendum)
#   Thrombocytosis- question essential thrombocytosis versus reactive. Patient has been off Hydrea for the last 12 months since March 2017; platelets of 460 [slightly above normal]. I still suspect this is more reactive rather essential thrombocytosis. Rule out iron deficiency.  #  Plan follow up in 6 months /cbc. Iron studies/cbc in 3 months.

## 2016-09-05 ENCOUNTER — Telehealth: Payer: Self-pay | Admitting: *Deleted

## 2016-09-05 ENCOUNTER — Encounter: Payer: Self-pay | Admitting: *Deleted

## 2016-09-05 ENCOUNTER — Inpatient Hospital Stay: Payer: Medicare Other | Attending: Internal Medicine

## 2016-09-05 DIAGNOSIS — D473 Essential (hemorrhagic) thrombocythemia: Secondary | ICD-10-CM | POA: Insufficient documentation

## 2016-09-05 LAB — CBC WITH DIFFERENTIAL/PLATELET
Basophils Absolute: 0.1 10*3/uL (ref 0–0.1)
Basophils Relative: 2 %
EOS ABS: 0.3 10*3/uL (ref 0–0.7)
Eosinophils Relative: 4 %
HEMATOCRIT: 35.6 % (ref 35.0–47.0)
HEMOGLOBIN: 12.1 g/dL (ref 12.0–16.0)
LYMPHS ABS: 1.7 10*3/uL (ref 1.0–3.6)
LYMPHS PCT: 27 %
MCH: 29.4 pg (ref 26.0–34.0)
MCHC: 34 g/dL (ref 32.0–36.0)
MCV: 86.3 fL (ref 80.0–100.0)
MONOS PCT: 4 %
Monocytes Absolute: 0.2 10*3/uL (ref 0.2–0.9)
NEUTROS ABS: 3.9 10*3/uL (ref 1.4–6.5)
NEUTROS PCT: 63 %
Platelets: 427 10*3/uL (ref 150–440)
RBC: 4.13 MIL/uL (ref 3.80–5.20)
RDW: 13.9 % (ref 11.5–14.5)
WBC: 6.3 10*3/uL (ref 3.6–11.0)

## 2016-09-05 LAB — IRON AND TIBC
Iron: 69 ug/dL (ref 28–170)
SATURATION RATIOS: 19 % (ref 10.4–31.8)
TIBC: 371 ug/dL (ref 250–450)
UIBC: 302 ug/dL

## 2016-09-05 LAB — FERRITIN: Ferritin: 17 ng/mL (ref 11–307)

## 2016-09-05 NOTE — Telephone Encounter (Signed)
Pt Returned my phone call to discuss her lab results. Lab results were provided to patient.

## 2016-12-05 ENCOUNTER — Inpatient Hospital Stay (HOSPITAL_BASED_OUTPATIENT_CLINIC_OR_DEPARTMENT_OTHER): Payer: Medicare Other | Admitting: Internal Medicine

## 2016-12-05 ENCOUNTER — Inpatient Hospital Stay: Payer: Medicare Other | Attending: Internal Medicine

## 2016-12-05 VITALS — BP 113/70 | HR 90 | Temp 97.6°F | Resp 20 | Ht 66.0 in | Wt 147.0 lb

## 2016-12-05 DIAGNOSIS — Z7982 Long term (current) use of aspirin: Secondary | ICD-10-CM | POA: Diagnosis not present

## 2016-12-05 DIAGNOSIS — K5790 Diverticulosis of intestine, part unspecified, without perforation or abscess without bleeding: Secondary | ICD-10-CM

## 2016-12-05 DIAGNOSIS — Z79899 Other long term (current) drug therapy: Secondary | ICD-10-CM | POA: Diagnosis not present

## 2016-12-05 DIAGNOSIS — Z803 Family history of malignant neoplasm of breast: Secondary | ICD-10-CM | POA: Diagnosis not present

## 2016-12-05 DIAGNOSIS — E785 Hyperlipidemia, unspecified: Secondary | ICD-10-CM | POA: Insufficient documentation

## 2016-12-05 DIAGNOSIS — D473 Essential (hemorrhagic) thrombocythemia: Secondary | ICD-10-CM

## 2016-12-05 DIAGNOSIS — R7989 Other specified abnormal findings of blood chemistry: Secondary | ICD-10-CM

## 2016-12-05 DIAGNOSIS — D75838 Other thrombocytosis: Secondary | ICD-10-CM

## 2016-12-05 LAB — CBC WITH DIFFERENTIAL/PLATELET
BASOS ABS: 0.2 10*3/uL — AB (ref 0–0.1)
Basophils Relative: 2 %
EOS ABS: 0.3 10*3/uL (ref 0–0.7)
EOS PCT: 4 %
HCT: 35.2 % (ref 35.0–47.0)
Hemoglobin: 12.1 g/dL (ref 12.0–16.0)
LYMPHS PCT: 30 %
Lymphs Abs: 2.1 10*3/uL (ref 1.0–3.6)
MCH: 29.5 pg (ref 26.0–34.0)
MCHC: 34.3 g/dL (ref 32.0–36.0)
MCV: 86.1 fL (ref 80.0–100.0)
MONO ABS: 0.3 10*3/uL (ref 0.2–0.9)
Monocytes Relative: 5 %
NEUTROS ABS: 4.3 10*3/uL (ref 1.4–6.5)
NEUTROS PCT: 59 %
PLATELETS: 433 10*3/uL (ref 150–440)
RBC: 4.08 MIL/uL (ref 3.80–5.20)
RDW: 13.4 % (ref 11.5–14.5)
WBC: 7.1 10*3/uL (ref 3.6–11.0)

## 2016-12-05 NOTE — Assessment & Plan Note (Addendum)
#   Thrombocytosis- question essential thrombocytosis versus reactive. Patient has been off Hydrea for the last 18 months since March 2017; today platelets are normal. I suspect she has reactive thrombocytosis; currently resolved.  # Patient has moved; she'll follow-up only as needed.

## 2016-12-05 NOTE — Progress Notes (Signed)
[place] OFFICE PROGRESS NOTE  Patient Care Team: Madelyn Brunner, MD as PCP - General (Internal Medicine)   SUMMARY OF ONCOLOGIC HISTORY: Oncology History   # July 2015- ESSENTIAL THROMBOCYTOSIS [platelets- 1.5Mil; BMBx- megakaryocytosis s/o ET; bcr-abl/jak-2/MPL/CALR-NEG] [Hydrea 500 4 times a week]; DEC 2016- Hydrea reduced to 3 times a week; MARCH 2017- STOP hydrea.   # Hx of Iron def [July 2015- Ferritin-8]; colo- march 2015-polyp/no bleeding. 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 Patient has been off Hydrea since March 2017.   Denies any tingling and numbness in her feet. Denies any burning pain.  No weight loss no loss of appetite. No blood clots. Patient takes aspirin every other day. She has recently moved to [place].   REVIEW OF SYSTEMS:  A complete 10 point review of system is done which is negative except mentioned above/history of present illness.   PAST MEDICAL HISTORY :  Past Medical History:  Diagnosis Date  . Allergy   . Anemia   . Asthma   . Chicken pox   . Colon polyps   . Diverticulitis   . Diverticulosis   . Essential thrombocythemia ([place])   . Essential thrombocytosis ([place])   . Glaucoma   . Hyperlipidemia   . Positive QuantiFERON-TB Gold test     PAST SURGICAL HISTORY :   Past Surgical History:  Procedure Laterality Date  . TRIGGER FINGER RELEASE Bilateral 2005, 2007, 2009   Thumbs and another finger    FAMILY HISTORY :   Family History  Problem Relation Age of Onset  . Hypertension Mother   . Stroke Mother   . Hypertension Father   . Heart disease Father   . Depression Paternal Grandmother   . Hypertension Sister   . Heart disease Sister   . Stroke Sister   . Diabetes Sister   . Breast cancer Sister 53  . Heart disease Brother   . Breast cancer  Paternal Aunt     SOCIAL HISTORY:   Social History  Substance Use Topics  . Smoking status: Never Smoker  . Smokeless tobacco: Never Used  . Alcohol use No    ALLERGIES:  is allergic to aspirin; beta adrenergic blockers; codeine sulfate; and paroxetine hcl.  MEDICATIONS:  Current Outpatient Prescriptions  Medication Sig Dispense Refill  . acetaminophen (TYLENOL) 500 MG tablet Take by mouth.    Marland Kitchen aspirin EC 81 MG tablet Take by mouth every other day.     . Calcium Carb-Cholecalciferol (CALCIUM + D3) 600-200 MG-UNIT TABS Take 1 Dose by mouth daily.    Marland Kitchen latanoprost (XALATAN) 0.005 % ophthalmic solution Apply to eye.    . Omega-3 Fatty Acids (FISH OIL) 1000 MG CAPS Take 2 capsules by mouth daily.    . polyethylene glycol powder (GLYCOLAX/MIRALAX) powder Take 17 g by mouth daily as needed.     . Probiotic Product ([facility] COLON HEALTH PO) Take 1 capsule by mouth daily.      No current facility-administered medications for this visit.     PHYSICAL EXAMINATION: ECOG PERFORMANCE STATUS: 0 - Asymptomatic  BP 113/70 (Patient Position: Sitting)   Pulse 90   Temp 97.6 F (36.4 C) (Tympanic)   Resp 20   Ht 5\' 6"  (1.676 m)   Wt 147 lb (66.7 kg)   BMI 23.73 kg/m  Filed Weights   12/05/16 1434  Weight: 147 lb (66.7 kg)    GENERAL: Well-nourished well-developed; Alert, no distress and comfortable. With her husband.    EYES: no pallor or icterus OROPHARYNX: no thrush or ulceration; good dentition  NECK: supple, no masses felt LYMPH:  no palpable lymphadenopathy in the cervical, axillary or inguinal regions LUNGS: clear to auscultation and  No wheeze or crackles HEART/CVS: regular rate & rhythm and no murmurs; No lower extremity edema; ABDOMEN:abdomen soft, non-tender and normal bowel sounds Musculoskeletal:no cyanosis of digits and no clubbing  PSYCH: alert & oriented x 3 with fluent speech NEURO: no focal motor/sensory deficits SKIN:  no rashes or significant  lesions  LABORATORY DATA:  I have reviewed the data as listed    Component Value Date/Time   NA 132 (L) 06/08/2015 1428   NA 140 03/09/2014 1517   K 3.8 06/08/2015 1428   K 4.1 03/09/2014 1517   CL 100 (L) 06/08/2015 1428   CL 104 03/09/2014 1517   CO2 28 06/08/2015 1428   CO2 30 03/09/2014 1517   GLUCOSE 85 06/08/2015 1428   GLUCOSE 85 03/09/2014 1517   BUN 16 06/08/2015 1428   BUN 19 (H) 03/09/2014 1517   CREATININE 0.75 06/08/2015 1428   CREATININE 0.76 03/09/2014 1517   CALCIUM 8.8 (L) 06/08/2015 1428   CALCIUM 8.4 (L) 03/09/2014 1517   PROT 7.5 06/08/2015 1428   PROT 6.6 03/09/2014 1517   ALBUMIN 4.0 06/08/2015 1428   ALBUMIN 3.3 (L) 03/09/2014 1517   AST 21 06/08/2015 1428   AST 16 03/09/2014 1517   ALT 13 (L) 06/08/2015 1428   ALT 22 03/09/2014 1517   ALKPHOS 82 06/08/2015 1428   ALKPHOS 101 03/09/2014 1517   BILITOT 0.4 06/08/2015 1428   BILITOT 0.2 03/09/2014 1517   GFRNONAA >60 06/08/2015 1428   GFRNONAA >60 03/09/2014 1517   GFRNONAA >60 11/30/2013 1502   GFRAA >60 06/08/2015 1428   GFRAA >60 03/09/2014 1517   GFRAA >60 11/30/2013 1502    No results found for: SPEP, UPEP  Lab Results  Component Value Date   WBC 7.1 12/05/2016   NEUTROABS 4.3 12/05/2016   HGB 12.1 12/05/2016   HCT 35.2 12/05/2016   MCV 86.1 12/05/2016   PLT 433 12/05/2016      Chemistry      Component Value Date/Time   NA 132 (L) 06/08/2015 1428   NA 140 03/09/2014 1517   K 3.8 06/08/2015 1428   K 4.1 03/09/2014 1517   CL 100 (L) 06/08/2015 1428   CL 104 03/09/2014 1517   CO2 28 06/08/2015 1428   CO2 30 03/09/2014 1517   BUN 16 06/08/2015 1428   BUN 19 (H) 03/09/2014 1517   CREATININE 0.75 06/08/2015 1428   CREATININE 0.76 03/09/2014 1517      Component Value Date/Time   CALCIUM 8.8 (L) 06/08/2015 1428   CALCIUM 8.4 (L) 03/09/2014 1517   ALKPHOS 82 06/08/2015 1428   ALKPHOS 101 03/09/2014 1517   AST 21 06/08/2015 1428   AST 16 03/09/2014 1517   ALT 13 (L)  06/08/2015 1428   ALT 22 03/09/2014 1517   BILITOT 0.4 06/08/2015 1428   BILITOT 0.2 03/09/2014 1517       RADIOGRAPHIC STUDIES: I have personally reviewed the radiological images as listed and agreed with the findings in the report. No results found.   ASSESSMENT & PLAN:  Essential thrombocytosis    Reactive thrombocytosis # Thrombocytosis- question essential thrombocytosis versus  reactive. Patient has been off Hydrea for the last 18 months since March 2017; today platelets are normal. I suspect she has reactive thrombocytosis; currently resolved.  # Patient has moved; she'll follow-up only as needed.       Cammie Sickle, MD 12/14/2016 8:13 PM

## 2017-06-14 ENCOUNTER — Telehealth: Payer: Self-pay | Admitting: Internal Medicine

## 2017-06-14 ENCOUNTER — Telehealth: Payer: Self-pay | Admitting: *Deleted

## 2017-06-14 NOTE — Telephone Encounter (Signed)
Patient called to report a platelet count of 474. Her PCP wanted to start her back on "urea" medication. She would like to speak with Dr. Andre Lefort nurse before restarting. She has no follow up appts. Scheduled. [phone].

## 2017-06-14 NOTE — Telephone Encounter (Signed)
I spoke to Tina Cook--platelets can go up and down; platelet count of 474;  would NOT recommend starting on hydrea at this time. Repeat cbc with PCP in 2 months [if platelets > 500 can consider re-starting hydrea]  Tina Cook lives in [place] [90 mins from [place]; awaiting appt with [facility] in June mid 2019;   [place]-

## 2018-09-16 ENCOUNTER — Other Ambulatory Visit: Payer: Self-pay | Admitting: Obstetrics and Gynecology

## 2018-09-16 DIAGNOSIS — L309 Dermatitis, unspecified: Secondary | ICD-10-CM

## 2018-09-16 DIAGNOSIS — N6459 Other signs and symptoms in breast: Secondary | ICD-10-CM

## 2018-09-25 ENCOUNTER — Other Ambulatory Visit: Payer: Self-pay

## 2018-09-25 ENCOUNTER — Ambulatory Visit
Admission: RE | Admit: 2018-09-25 | Discharge: 2018-09-25 | Disposition: A | Discharge status: Home / Self Care | Payer: Medicare Other | Source: Ambulatory Visit | Attending: Obstetrics and Gynecology | Admitting: Obstetrics and Gynecology

## 2018-09-25 ENCOUNTER — Ambulatory Visit: Payer: Medicare Other

## 2018-09-25 DIAGNOSIS — L309 Dermatitis, unspecified: Secondary | ICD-10-CM | POA: Diagnosis present

## 2018-09-25 DIAGNOSIS — N6459 Other signs and symptoms in breast: Secondary | ICD-10-CM

## 2018-10-01 ENCOUNTER — Other Ambulatory Visit: Payer: Self-pay | Admitting: Obstetrics and Gynecology

## 2018-10-01 DIAGNOSIS — L309 Dermatitis, unspecified: Secondary | ICD-10-CM

## 2018-10-01 DIAGNOSIS — N6452 Nipple discharge: Secondary | ICD-10-CM

## 2018-10-15 ENCOUNTER — Ambulatory Visit
Admission: RE | Admit: 2018-10-15 | Discharge: 2018-10-15 | Disposition: A | Discharge status: Home / Self Care | Payer: Medicare Other | Source: Ambulatory Visit | Attending: Obstetrics and Gynecology | Admitting: Obstetrics and Gynecology

## 2018-10-15 ENCOUNTER — Other Ambulatory Visit: Payer: Self-pay

## 2018-10-15 DIAGNOSIS — L309 Dermatitis, unspecified: Secondary | ICD-10-CM | POA: Insufficient documentation

## 2018-10-15 DIAGNOSIS — N6452 Nipple discharge: Secondary | ICD-10-CM | POA: Insufficient documentation

## 2018-10-15 LAB — POCT I-STAT CREATININE: Creatinine, Ser: 0.8 mg/dL (ref 0.44–1.00)

## 2018-10-15 MED ORDER — GADOBUTROL 1 MMOL/ML IV SOLN
6.0000 mL | Freq: Once | INTRAVENOUS | Status: AC | PRN
  Administered 2018-10-15: 6 mL via INTRAVENOUS

## 2018-10-30 ENCOUNTER — Other Ambulatory Visit: Payer: Self-pay | Admitting: Obstetrics and Gynecology

## 2018-10-30 DIAGNOSIS — N6310 Unspecified lump in the right breast, unspecified quadrant: Secondary | ICD-10-CM

## 2018-10-31 ENCOUNTER — Other Ambulatory Visit: Payer: Self-pay

## 2018-10-31 ENCOUNTER — Ambulatory Visit: Admission: RE | Admit: 2018-10-31 | Payer: Medicare Other | Source: Ambulatory Visit

## 2018-10-31 ENCOUNTER — Other Ambulatory Visit: Payer: Self-pay | Admitting: Obstetrics and Gynecology

## 2018-10-31 ENCOUNTER — Ambulatory Visit
Admission: RE | Admit: 2018-10-31 | Discharge: 2018-10-31 | Disposition: A | Discharge status: Home / Self Care | Payer: Medicare Other | Source: Ambulatory Visit | Attending: Obstetrics and Gynecology | Admitting: Obstetrics and Gynecology

## 2018-10-31 DIAGNOSIS — N6310 Unspecified lump in the right breast, unspecified quadrant: Secondary | ICD-10-CM

## 2018-10-31 MED ORDER — GADOBUTROL 1 MMOL/ML IV SOLN
7.0000 mL | Freq: Once | INTRAVENOUS | Status: AC | PRN
  Administered 2018-10-31: 10:00:00 7 mL via INTRAVENOUS
